# Patient Record
Sex: Female | Born: 1990 | Race: White | Hispanic: No | Marital: Single | Smoking: Never smoker
Health system: Southern US, Community
[De-identification: ages and names within clinical notes are randomized; demographics above are authoritative.]

## PROBLEM LIST (undated history)

## (undated) ENCOUNTER — Inpatient Hospital Stay (HOSPITAL_COMMUNITY): Payer: Self-pay

## (undated) DIAGNOSIS — Z9889 Other specified postprocedural states: Secondary | ICD-10-CM

## (undated) DIAGNOSIS — R112 Nausea with vomiting, unspecified: Secondary | ICD-10-CM

## (undated) HISTORY — PX: TOE AMPUTATION: SHX809

## (undated) HISTORY — PX: TONSILLECTOMY: SUR1361

---

## 2012-01-10 ENCOUNTER — Encounter: Payer: Self-pay | Admitting: Obstetrics and Gynecology

## 2012-01-11 ENCOUNTER — Other Ambulatory Visit: Payer: Self-pay | Admitting: Obstetrics & Gynecology

## 2012-01-13 ENCOUNTER — Encounter (HOSPITAL_COMMUNITY): Payer: Self-pay | Admitting: Pharmacist

## 2012-01-26 ENCOUNTER — Ambulatory Visit: Admit: 2012-01-26 | Payer: Self-pay | Admitting: Obstetrics & Gynecology

## 2012-01-26 SURGERY — LAPAROSCOPY OPERATIVE
Anesthesia: General

## 2012-02-21 ENCOUNTER — Emergency Department (HOSPITAL_COMMUNITY)
Admission: EM | Admit: 2012-02-21 | Discharge: 2012-02-22 | Disposition: A | Discharge status: Home / Self Care | Payer: Managed Care, Other (non HMO) | Attending: Emergency Medicine | Admitting: Emergency Medicine

## 2012-02-21 ENCOUNTER — Emergency Department (HOSPITAL_COMMUNITY): Payer: Managed Care, Other (non HMO)

## 2012-02-21 ENCOUNTER — Encounter (HOSPITAL_COMMUNITY): Payer: Self-pay

## 2012-02-21 DIAGNOSIS — O9989 Other specified diseases and conditions complicating pregnancy, childbirth and the puerperium: Secondary | ICD-10-CM | POA: Insufficient documentation

## 2012-02-21 DIAGNOSIS — R109 Unspecified abdominal pain: Secondary | ICD-10-CM | POA: Insufficient documentation

## 2012-02-21 DIAGNOSIS — O200 Threatened abortion: Secondary | ICD-10-CM | POA: Insufficient documentation

## 2012-02-21 LAB — URINE MICROSCOPIC-ADD ON

## 2012-02-21 LAB — CBC
HCT: 40.4 % (ref 36.0–46.0)
MCH: 32.2 pg (ref 26.0–34.0)
MCHC: 34.7 g/dL (ref 30.0–36.0)
MCV: 92.9 fL (ref 78.0–100.0)
Platelets: 264 10*3/uL (ref 150–400)
RDW: 11.8 % (ref 11.5–15.5)
WBC: 7.7 10*3/uL (ref 4.0–10.5)

## 2012-02-21 LAB — URINALYSIS, ROUTINE W REFLEX MICROSCOPIC
Glucose, UA: NEGATIVE mg/dL
Ketones, ur: NEGATIVE mg/dL
Leukocytes, UA: NEGATIVE
Protein, ur: NEGATIVE mg/dL
pH: 7 (ref 5.0–8.0)

## 2012-02-21 NOTE — ED Provider Notes (Signed)
[place] of low abdominal pain onset 5 PM today slight amount of bleeding. Patient currently [redacted] weeks pregnant. Patient alert no distress.  Doug Sou, MD 02/21/12 [phone]

## 2012-02-21 NOTE — ED Notes (Signed)
Ambulatory to room, alert, NAD, calm, interactive, steady gait.

## 2012-02-21 NOTE — ED Notes (Signed)
To [place] prior to I&O.

## 2012-02-21 NOTE — ED Provider Notes (Signed)
History     CSN: [id number]  Arrival date & time 02/21/12  1925   First MD Initiated Contact with Patient 02/21/12 2122     Chief Complaint  Patient presents with  . Abdominal Pain   HPI: Lindsay Woodard is a 21 yo CF, G2P1, at [redacted] weeks gestation, with history of previous miscarriage, presents with abdominal cramping and vaginal spotting. Positive pregnancy test one week ago but has not had a confirmed IUP by [place]. Pain is located in the lower abdomen, described as cramping, currently pain free. Bleeding has been minimal as well. She denies syncope, dizziness, vomiting, diarrhea or recent abdominal trauma.   History reviewed. No pertinent past medical history.  Past Surgical History  Procedure Date  . Toe amputation   . Tonsillectomy    History reviewed. No pertinent family history.  History  Substance Use Topics  . Smoking status: Never Smoker   . Smokeless tobacco: Not on file  . Alcohol Use:     OB History    Grav Para Term Preterm Abortions TAB SAB Ect Mult Living   2    1  1         Review of Systems  Constitutional: Negative for fever, chills and fatigue.  HENT: Negative for congestion and rhinorrhea.   Eyes: Negative for photophobia and visual disturbance.  Respiratory: Negative for cough, chest tightness and shortness of breath.   Cardiovascular: Negative for chest pain and palpitations.  Gastrointestinal: Positive for abdominal pain. Negative for nausea, vomiting and diarrhea.  Genitourinary: Positive for vaginal bleeding. Negative for dysuria, flank pain and decreased urine volume.  Musculoskeletal: Negative for myalgias, back pain and arthralgias.  Skin: Negative for pallor and rash.  Neurological: Negative for dizziness, syncope, light-headedness and headaches.  Psychiatric/Behavioral: Negative for confusion and agitation.  All other systems reviewed and are negative.    Allergies  Review of patient's allergies indicates no known allergies.  Home Medications    No current outpatient prescriptions on file.  BP 113/62  Pulse 90  Temp 98.5 F (36.9 C) (Oral)  Resp 18  SpO2 97%  LMP 01/17/2012  Physical Exam  Nursing note and vitals reviewed. Constitutional: She is oriented to person, place, and time. She appears well-developed and well-nourished. She is cooperative. No distress.  HENT:  Head: Normocephalic and atraumatic.  Mouth/Throat: Oropharynx is clear and moist.  Eyes: Conjunctivae normal and EOM are normal. Pupils are equal, round, and reactive to light.  Neck: Normal range of motion. Neck supple.  Cardiovascular: Normal rate, regular rhythm, normal heart sounds and intact distal pulses.   Pulmonary/Chest: Effort normal and breath sounds normal.  Abdominal: Soft. Bowel sounds are normal. There is no tenderness. There is no guarding.  Genitourinary: Cervix exhibits no motion tenderness. Right adnexum displays no tenderness and no fullness. Left adnexum displays no tenderness and no fullness. There is bleeding (small amount of dry blood in the vaginal vault) around the vagina.    Musculoskeletal: Normal range of motion. She exhibits no edema.  Neurological: She is alert and oriented to person, place, and time.  Skin: Skin is warm and dry.    ED Course  Procedures  Labs Reviewed  HCG, QUANTITATIVE, PREGNANCY - Abnormal; Notable for the following:    hCG, Beta Chain, Quant, S 539 (*)     All other components within normal limits  POCT PREGNANCY, URINE - Abnormal; Notable for the following:    Preg Test, Ur POSITIVE (*)     All other components  within normal limits  URINALYSIS, ROUTINE W REFLEX MICROSCOPIC - Abnormal; Notable for the following:    APPearance CLOUDY (*)     Hgb urine dipstick LARGE (*)     All other components within normal limits  URINE MICROSCOPIC-ADD ON - Abnormal; Notable for the following:    Squamous Epithelial / LPF MANY (*)     Bacteria, UA MANY (*)     All other components within normal limits  CBC   ABO/RH  URINALYSIS, ROUTINE W REFLEX MICROSCOPIC   No results found.  1. Threatened abortion    MDM  21 yo CF, G2P1, at [redacted] weeks gestation, with history of previous miscarriage, presents with abdominal cramping and vaginal spotting. Afebrile, vital signs stable. Abdomen benign. Concern for ectopic vs threatened abortion. OS closed on exam. No adnexal tenderness. HCG 539, Hgb 14. UA with many bacteria, no leuks, many epi cells, likely contamination. Repeat with rare bacteria and few epi cells, still feel likely contamination but she does complain of suprapubic pain so will treat with Keflex. Ultrasound shows IUP c/w with dates by LMP. No adnexal mass or free fluid, doubt ectopic. Presentation c/w threatened abortion. Discussed with patient's Ob, Dr. Tamela Oddi, they will see in clinic on Friday. Patient is to call for appointment tomorrow morning. Felt she was stable for outpatient management as she is HD stable, benign abdomen, confirmed IUP, has close outpatient f/u. Return precautions given. Patient in agreement with plan.   Reviewed imaging, labs and previous medical records, utilized in MDM  Discussed case with Dr. Ethelda Chick  Clinical Impression 1. Threatened abortion.         Margie Billet, MD 02/22/12 [phone]

## 2012-02-21 NOTE — ED Notes (Signed)
Pt reports she is 5 weeks pregnat, developed sudden onset of suprapubic cramping and passing a small amount of blood starting approx 1700 today

## 2012-02-21 NOTE — ED Notes (Signed)
Sitting on stretcher, visitor at [facility], reports mild cramping, denies need for med, denies nausea or other sx at this time, aware of need for I&O cath UA & pending [place].

## 2012-02-22 LAB — URINALYSIS, ROUTINE W REFLEX MICROSCOPIC
Bilirubin Urine: NEGATIVE
Glucose, UA: NEGATIVE mg/dL
Ketones, ur: 40 mg/dL — AB
Nitrite: NEGATIVE
Protein, ur: NEGATIVE mg/dL
pH: 7 (ref 5.0–8.0)

## 2012-02-22 LAB — URINALYSIS, MICROSCOPIC ONLY
Bilirubin Urine: NEGATIVE
Hgb urine dipstick: NEGATIVE
Ketones, ur: 40 mg/dL — AB
Specific Gravity, Urine: 1.024 (ref 1.005–1.030)
pH: 7 (ref 5.0–8.0)

## 2012-02-22 MED ORDER — CEPHALEXIN 500 MG PO CAPS: 500.0000 mg | ORAL_CAPSULE | Freq: Two times a day (BID) | ORAL | Status: DC

## 2012-02-22 NOTE — ED Notes (Signed)
Pt back from [place], no changes, calm, alert, NAD, visitor at [facility].

## 2012-02-22 NOTE — ED Notes (Signed)
Updated.

## 2012-02-22 NOTE — ED Notes (Signed)
Pt updated, pending microscopic urine results.

## 2012-02-22 NOTE — ED Notes (Addendum)
Pt choosing to leave, out the door with visitor, no changes, "did not want to wait any longer", "would f/u with medical records and her ob/gyn". No changes, alert, NAD, calm, steady gait. Leaving prior to receiving paperwork or speaking with MD.

## 2012-02-22 NOTE — ED Notes (Signed)
updated

## 2012-02-23 ENCOUNTER — Inpatient Hospital Stay (HOSPITAL_COMMUNITY)
Admission: AD | Admit: 2012-02-23 | Discharge: 2012-02-23 | Disposition: A | Discharge status: Home / Self Care | Payer: Managed Care, Other (non HMO) | Source: Ambulatory Visit | Attending: Obstetrics & Gynecology | Admitting: Obstetrics & Gynecology

## 2012-02-23 ENCOUNTER — Encounter (HOSPITAL_COMMUNITY): Payer: Self-pay | Admitting: *Deleted

## 2012-02-23 DIAGNOSIS — O0281 Inappropriate change in quantitative human chorionic gonadotropin (hCG) in early pregnancy: Secondary | ICD-10-CM

## 2012-02-23 DIAGNOSIS — O200 Threatened abortion: Secondary | ICD-10-CM | POA: Insufficient documentation

## 2012-02-23 LAB — URINE CULTURE

## 2012-02-23 MED ORDER — HYDROCODONE-ACETAMINOPHEN 5-500 MG PO TABS
ORAL_TABLET | ORAL | Status: DC
Start: 2012-02-23 — End: 2012-09-25

## 2012-02-23 MED ORDER — KETOROLAC TROMETHAMINE 60 MG/2ML IM SOLN
60.0000 mg | Freq: Once | INTRAMUSCULAR | Status: AC
  Administered 2012-02-23: 60 mg via INTRAMUSCULAR
  Filled 2012-02-23: qty 2

## 2012-02-23 NOTE — MAU Provider Note (Signed)
History     CSN: [id number]  Arrival date and time: 02/23/12 7253   First Provider Initiated Contact with Patient 02/23/12 2057      Chief Complaint  Patient presents with  . Vaginal Bleeding  . Abdominal Cramping   HPI Lindsay Woodard 21 y.o. LMP 01-17-12  GA 5w 2d  Was seen at [facility] ER on 02-21-12.  Positive pregnancy test with quant of 539.  Had some spotting and was seen in the office on 02-22-12 but was too early for full quant evaluation.  (See ultrasound results from 02-21-12 listed below.)  Previous chart reviewed.  Has continued to have some lower abdominal cramping which has been worse today (5/10) and some small quarter sized blood clots.  Today's bleeding is different from the miscarriage she has had earlier and client is wondering what is happening.  Blood type is A positive.  OB History    Grav Para Term Preterm Abortions TAB SAB Ect Mult Living   2    1  1          History reviewed. No pertinent past medical history.  Past Surgical History  Procedure Date  . Toe amputation   . Tonsillectomy     No family history on file.  History  Substance Use Topics  . Smoking status: Never Smoker   . Smokeless tobacco: Not on file  . Alcohol Use:     Allergies: No Known Allergies  Prescriptions prior to admission  Medication Sig Dispense Refill  . Prenatal Vit-Fe Fumarate-FA (PRENATAL MULTIVITAMIN) TABS Take 1 tablet by mouth daily.        Review of Systems  Constitutional: Negative for fever.  Gastrointestinal: Positive for abdominal pain. Negative for nausea, vomiting, diarrhea and constipation.  Genitourinary: Negative for dysuria.       Vaginal bleeding.   Physical Exam   Temperature 98.4 F (36.9 C), temperature source Oral, resp. rate 20, height 5' (1.524 m), weight 68.04 kg (150 lb), last menstrual period 01/17/2012.  Physical Exam  Nursing note and vitals reviewed. Constitutional: She is oriented to person, place, and time. She appears well-developed and  well-nourished.  HENT:  Head: Normocephalic.  Eyes: EOM are normal.  Neck: Neck supple.  GI: Soft. There is tenderness. There is no rebound and no guarding.       Mild lower abdominal tenderness  Musculoskeletal: Normal range of motion.  Neurological: She is alert and oriented to person, place, and time.  Skin: Skin is warm and dry.  Psychiatric: She has a normal mood and affect.    MAU Course  Procedures  Ultrasound done on 02-21-12 Clinical Data: Abdominal pain and cramping. Vaginal bleeding. 5-  week-0-day gestational age by LMP.  OBSTETRIC <14 WK [place] AND TRANSVAGINAL OB US  Technique: Both transabdominal and transvaginal ultrasound  examinations were performed for complete evaluation of the  gestation as well as the maternal uterus, adnexal regions, and  pelvic cul-de-sac. Transvaginal technique was performed to assess  early pregnancy.  Comparison: None.  Intrauterine gestational sac: Visualized/normal in shape.  Yolk sac: None visualized  Embryo: None visualized  MSD: 3 mm 5 w 0 d  Maternal uterus/adnexae:  Both ovaries are normal in appearance. No mass or free fluid  identified.  IMPRESSION:  1. Single 5-week intrauterine gestational sac, which is concordant  with LMP.  2. No adnexal mass or free fluid identified  MDM Results for orders placed during the hospital encounter of 02/23/12 (from the past 24 hour(s))  HCG, QUANTITATIVE,  PREGNANCY     Status: Abnormal   Collection Time   02/23/12  7:45 PM      Component Value Range   hCG, Beta Chain, Quant, S 560 (*) <5 mIU/mL    2110  Consult with Dr. Tamela Oddi re: plan of care  Assessment and Plan  Inappropriate rise in quants suggesting inevidable miscarriage  Plan Will give Toradol 60 mg IM for pain in MAU as client's pain has increased. Rx vicodin one po q 4-6 hours as needed for pain (#8) no refills Follow up in the office on Monday. Call your doctor if you have questions or concerns. Return if you have  severe pain or severe bleeding.    BURLESON,TERRI 02/23/2012, 8:57 PM

## 2012-02-23 NOTE — MAU Note (Signed)
I'm [redacted]wks pregnant. Started passing a lot of blood clots today and having cramping

## 2012-02-23 NOTE — ED Provider Notes (Signed)
I have personally seen and examined the patient.  I have discussed the plan of care with the resident.  I have reviewed the documentation on PMH/FH/Soc. History.  I have reviewed the documentation of the resident and agree.  Muslima Toppins, MD 02/23/12 1453 

## 2012-02-23 NOTE — MAU Note (Signed)
Cramping started today with bleeding, passing clots.

## 2012-02-26 ENCOUNTER — Other Ambulatory Visit: Payer: Self-pay | Admitting: Obstetrics & Gynecology

## 2012-02-26 DIAGNOSIS — O3680X0 Pregnancy with inconclusive fetal viability, not applicable or unspecified: Secondary | ICD-10-CM

## 2012-02-29 ENCOUNTER — Ambulatory Visit (HOSPITAL_COMMUNITY)
Admission: RE | Admit: 2012-02-29 | Discharge: 2012-02-29 | Disposition: A | Discharge status: Home / Self Care | Payer: Managed Care, Other (non HMO) | Source: Ambulatory Visit | Attending: Obstetrics & Gynecology | Admitting: Obstetrics & Gynecology

## 2012-02-29 DIAGNOSIS — Z3689 Encounter for other specified antenatal screening: Secondary | ICD-10-CM | POA: Insufficient documentation

## 2012-02-29 DIAGNOSIS — O209 Hemorrhage in early pregnancy, unspecified: Secondary | ICD-10-CM | POA: Insufficient documentation

## 2012-02-29 DIAGNOSIS — O3680X0 Pregnancy with inconclusive fetal viability, not applicable or unspecified: Secondary | ICD-10-CM

## 2012-03-20 NOTE — L&D Delivery Note (Signed)
Delivery Note At 9:51 PM a viable female was delivered via  (Presentation: ;  ).  APGAR: , ; weight .   Placenta status: , .  Cord:  with the following complications: .  Cord pH: not done  Anesthesia:   Episiotomy:  Lacerations:  Suture Repair: 2.0 Est. Blood Loss (mL):   Mom to postpartum.  Baby to Couplet care / Skin to Skin.  Lindsay Woodard A 02/22/2013, 10:04 PM

## 2012-06-12 ENCOUNTER — Ambulatory Visit: Payer: Self-pay | Admitting: Obstetrics & Gynecology

## 2012-08-13 ENCOUNTER — Encounter: Payer: Self-pay | Admitting: Obstetrics

## 2012-08-13 ENCOUNTER — Ambulatory Visit (INDEPENDENT_AMBULATORY_CARE_PROVIDER_SITE_OTHER): Payer: Managed Care, Other (non HMO) | Admitting: Obstetrics

## 2012-08-13 VITALS — BP 109/70 | Temp 97.6°F | Wt 147.6 lb

## 2012-08-13 DIAGNOSIS — Z3482 Encounter for supervision of other normal pregnancy, second trimester: Secondary | ICD-10-CM

## 2012-08-13 DIAGNOSIS — Z3201 Encounter for pregnancy test, result positive: Secondary | ICD-10-CM

## 2012-08-13 LAB — POCT URINALYSIS DIPSTICK
Blood, UA: NEGATIVE
Spec Grav, UA: 1.02
Urobilinogen, UA: NEGATIVE

## 2012-08-13 NOTE — Progress Notes (Signed)
Pulse- 86 . Subjective:    Lindsay Woodard is being seen today for her first obstetrical visit.  This is not a planned pregnancy. She is at [redacted]w[redacted]d gestation. Her obstetrical history is significant for none. Relationship with FOB: significant other, living together. Patient does intend to breast feed. Pregnancy history fully reviewed.  Patient has a family history of Junvenile Dermatomyositis.  Menstrual History: OB History   Grav Para Term Preterm Abortions TAB SAB Ect Mult Living   3    2  1          Menarche age: 10 Patient's last menstrual period was 05/14/2012.    The following portions of the patient's history were reviewed and updated as appropriate: allergies, current medications, past family history, past medical history, past social history, past surgical history and problem list.  Review of Systems Pertinent items are noted in HPI.    Objective:    General appearance: alert and no distress Abdomen: normal findings: soft, non-tender Pelvic: cervix normal in appearance, no cervical motion tenderness and vagina normal without discharge    Assessment:    Pregnancy at [redacted]w[redacted]d weeks    Plan:    Initial labs drawn. Prenatal vitamins. Problem list reviewed and updated. AFP3 discussed: requested. Role of ultrasound in pregnancy discussed; fetal survey: requested. Amniocentesis discussed: not indicated. Follow up in 2 weeks. 50% of 20 min visit spent on counseling and coordination of care.

## 2012-08-14 ENCOUNTER — Encounter: Payer: Self-pay | Admitting: Obstetrics

## 2012-08-14 LAB — PAP IG W/ RFLX HPV ASCU

## 2012-08-14 LAB — HIV ANTIBODY (ROUTINE TESTING W REFLEX): HIV: NONREACTIVE

## 2012-08-14 LAB — OBSTETRIC PANEL
Eosinophils Relative: 1 % (ref 0–5)
HCT: 37.1 % (ref 36.0–46.0)
Hemoglobin: 13 g/dL (ref 12.0–15.0)
Lymphocytes Relative: 19 % (ref 12–46)
Lymphs Abs: 1.6 10*3/uL (ref 0.7–4.0)
MCV: 90.9 fL (ref 78.0–100.0)
Monocytes Absolute: 0.5 10*3/uL (ref 0.1–1.0)
Monocytes Relative: 6 % (ref 3–12)
RBC: 4.08 MIL/uL (ref 3.87–5.11)
Rubella: 3.63 Index — ABNORMAL HIGH (ref <0.90)
WBC: 8.7 10*3/uL (ref 4.0–10.5)

## 2012-08-14 LAB — CULTURE, OB URINE: Colony Count: NO GROWTH

## 2012-08-14 LAB — GC/CHLAMYDIA PROBE AMP: CT Probe RNA: NEGATIVE

## 2012-08-14 LAB — WET PREP BY MOLECULAR PROBE: Gardnerella vaginalis: NEGATIVE

## 2012-08-14 LAB — VITAMIN D 25 HYDROXY (VIT D DEFICIENCY, FRACTURES): Vit D, 25-Hydroxy: 49 ng/mL (ref 30–89)

## 2012-08-15 LAB — HEMOGLOBINOPATHY EVALUATION: Hgb S Quant: 0 %

## 2012-09-02 ENCOUNTER — Ambulatory Visit (INDEPENDENT_AMBULATORY_CARE_PROVIDER_SITE_OTHER): Payer: Managed Care, Other (non HMO) | Admitting: Obstetrics & Gynecology

## 2012-09-02 ENCOUNTER — Encounter: Payer: Self-pay | Admitting: Obstetrics & Gynecology

## 2012-09-02 VITALS — BP 105/65 | Temp 98.8°F | Wt 147.2 lb

## 2012-09-02 DIAGNOSIS — Z3402 Encounter for supervision of normal first pregnancy, second trimester: Secondary | ICD-10-CM

## 2012-09-02 DIAGNOSIS — Z3481 Encounter for supervision of other normal pregnancy, first trimester: Secondary | ICD-10-CM

## 2012-09-02 DIAGNOSIS — Z3400 Encounter for supervision of normal first pregnancy, unspecified trimester: Secondary | ICD-10-CM | POA: Insufficient documentation

## 2012-09-02 LAB — POCT URINALYSIS DIPSTICK
Blood, UA: NEGATIVE
Nitrite, UA: NEGATIVE
Protein, UA: NEGATIVE
Urobilinogen, UA: NEGATIVE
pH, UA: 5.5

## 2012-09-02 NOTE — Progress Notes (Signed)
Pulse-78 No complaints.  

## 2012-09-02 NOTE — Patient Instructions (Signed)
CF Gene Mutation Testing This is a test used to detect cystic fibrosis (CF) genetic mutations to establish CF carrier status or to establish the diagnosis of CF in an individual. The CF gene mutation test identifies mutations in the CFTR gene on chromosome 7. Each cell in the human body (except sperm and eggs) has 46 chromosomes (23 inherited from the mother and 23 from the father). Genes on these chromosomes form the body's blueprint for producing proteins that control body functions. Cystic fibrosis is caused by a mutation in a pair of genes located on chromosomes 7. Both copies of this gene must be abnormal to cause CF. If only one copy of the gene pair is mutated, the patient will be a carrier. Carriers are not ill, they do not have any symptoms, but they can pass their abnormal CF gene copy on to their children.  When a newborn infant has meconium ileus (no stools in the first 24 to 48 hours of life) or when a person has symptoms of CF (salty sweat, persistent respiratory infections, wheezing, persistent diarrhea, foul-smelling greasy stools, malnutrition, and vitamin deficiency); if a person has a positive sweat chloride or IRT test or a close relative who has been diagnosed with CF; when a patient is undergoing genetic counseling and wants to find out if they are a CF carrier; or for prenatal diagnosis. PREPARATION FOR TEST A blood sample drawn from an infant's heel; a spot of blood that is put onto filter paper; or a blood sample is drawn from a vein in the arm. NORMAL FINDINGS No genetic mutation. Ranges for normal findings may vary among different laboratories and hospitals. You should always check with your doctor after having lab work or other tests done to discuss the meaning of your test results and whether your values are considered within normal limits. MEANING OF TEST Your caregiver will go over the test results with you and discuss the importance and meaning of your results, as well as  treatment options and the need for additional tests if necessary. OBTAINING THE TEST RESULTS It is your responsibility to obtain your test results. Ask the lab or department performing the test when and how you will get your results. Document Released: 03/30/2004 Document Revised: 05/29/2011 Document Reviewed: 02/12/2008 [facility] Patient Information 2014 [place], [place]. AFP Maternal This is a routine screen (tests) used to check for fetal abnormalities such as Down syndrome and neural tube defects. Down Syndrome is a chromosomal abnormality, sometimes called Trisomy 73. Neural tube defects are serious birth defects. The brain, spinal cord, or their coverings do not develop completely. Women should be tested in the 15th to 20th week of pregnancy. The msAFP screen involves three or four tests that measure substances found in the blood that make the testing better. During development, AFP levels in fetal blood and amniotic fluid rise until about 12 weeks. The levels then gradually fall until birth. AFP is a protein produce by fetal tissue. AFP crosses the placenta and appears in the maternal blood. A baby with an open neural tube defect has an opening in its spine, head, or abdominal wall that allows higher-than-usual amounts of AFP to pass into the mother's blood. If a screen is positive, more tests are needed to make a diagnosis. These include ultrasound and perhaps amniocentesis (checking the fluid that surrounds the baby). These tests are used to help women and their caregivers make decisions about the management of their pregnancies. In pregnancies where the fetus is carrying the chromosomal  defect that results in Down syndrome, the levels of AFP and unconjugated estriol tend to be low and hCG and inhibin A levels high.  PREPARATION FOR TEST Blood is drawn from a vein in your arm usually between the 15th and 20th weeks of pregnancy. Four different tests on your blood are done. These are AFP, hCG,  unconjugated estriol, and inhibin A. The combination of tests produces a more accurate result. NORMAL FINDINGS   Adult: less than 40ng/mL or less than 40 mg/L (SI units)  Child younger than1 year: less than 30 ng/mL Ranges are stratified by weeks of gestation and vary among laboratories. Ranges for normal findings may vary among different laboratories and hospitals. You should always check with your doctor after having lab work or other tests done to discuss the meaning of your test results and whether your values are considered within normal limits. MEANING OF TEST  These are screening tests. Not all fetal abnormalities will give positive test results. Of all women who have positive AFP screening results, only a very small number of them have babies who actually have a neural tube defect or chromosomal abnormality. Your caregiver will go over the test results with you and discuss the importance and meaning of your results, as well as treatment options and the need for additional tests if necessary. OBTAINING THE TEST RESULTS It is your responsibility to obtain your test results. Ask the lab or department performing the test when and how you will get your results. Document Released: 03/28/2004 Document Revised: 05/29/2011 Document Reviewed: 02/08/2008 [facility] Patient Information 2014 [place], [place].

## 2012-09-02 NOTE — Progress Notes (Signed)
Doing well 

## 2012-09-03 LAB — AFP, QUAD SCREEN
AFP: 25.5 IU/mL
Age Alone: 1:1150 {titer}
MoM for AFP: 0.85
Trisomy 18 (Edward) Syndrome Interp.: 1:87800 {titer}

## 2012-09-04 LAB — CYSTIC FIBROSIS DIAGNOSTIC STUDY

## 2012-09-22 ENCOUNTER — Other Ambulatory Visit: Payer: Self-pay | Admitting: *Deleted

## 2012-09-22 DIAGNOSIS — Z3402 Encounter for supervision of normal first pregnancy, second trimester: Secondary | ICD-10-CM

## 2012-09-25 ENCOUNTER — Encounter: Payer: Self-pay | Admitting: Obstetrics & Gynecology

## 2012-09-25 ENCOUNTER — Other Ambulatory Visit: Payer: Managed Care, Other (non HMO)

## 2012-09-25 ENCOUNTER — Ambulatory Visit (INDEPENDENT_AMBULATORY_CARE_PROVIDER_SITE_OTHER): Payer: Managed Care, Other (non HMO) | Admitting: Obstetrics & Gynecology

## 2012-09-25 VITALS — BP 100/64 | Temp 97.9°F | Wt 153.0 lb

## 2012-09-25 DIAGNOSIS — Z3480 Encounter for supervision of other normal pregnancy, unspecified trimester: Secondary | ICD-10-CM

## 2012-09-25 DIAGNOSIS — Z3482 Encounter for supervision of other normal pregnancy, second trimester: Secondary | ICD-10-CM

## 2012-09-25 DIAGNOSIS — Z3402 Encounter for supervision of normal first pregnancy, second trimester: Secondary | ICD-10-CM

## 2012-09-25 LAB — POCT URINALYSIS DIPSTICK
Ketones, UA: NEGATIVE
Leukocytes, UA: NEGATIVE
Nitrite, UA: NEGATIVE
Protein, UA: NEGATIVE
pH, UA: 7

## 2012-09-25 NOTE — Progress Notes (Signed)
Pulse-71 Pt c/o "pulling" with urination x 2 weeks. Pt c/o increased left hip pain x 2 weeks worse with exertion.

## 2012-09-25 NOTE — Patient Instructions (Addendum)
Glucose Tolerance Test This is a test to see how your body processes carbohydrates. This test is often done to check patients for diabetes or the possibility of developing it. PREPARATION FOR TEST You should have nothing to eat or drink 12 hours before the test. You will be given a form of sugar (glucose) and then blood samples will be drawn from your vein to determine the level of sugar in your blood. Alternatively, blood may be drawn from your finger for testing. You should not smoke or exercise during the test. NORMAL FINDINGS  Fasting: 70-115 mg/dL  30 minutes: less than 200 mg/dL  1 hour: less than 200 mg/dL  2 hours: less than 140 mg/dL  3 hours: 70-115 mg/dL  4 hours: 70-115 mg/dL Ranges for normal findings may vary among different laboratories and hospitals. You should always check with your doctor after having lab work or other tests done to discuss the meaning of your test results and whether your values are considered within normal limits. MEANING OF TEST Your caregiver will go over the test results with you and discuss the importance and meaning of your results, as well as treatment options and the need for additional tests. OBTAINING THE TEST RESULTS It is your responsibility to obtain your test results. Ask the lab or department performing the test when and how you will get your results. Document Released: 03/29/2004 Document Revised: 05/29/2011 Document Reviewed: 02/15/2008 [employer] Patient Information 2014 [employer].  

## 2012-09-25 NOTE — Progress Notes (Signed)
Doing well.  Supportive measures reviewed.

## 2012-09-27 ENCOUNTER — Ambulatory Visit (HOSPITAL_COMMUNITY)
Admission: RE | Admit: 2012-09-27 | Discharge: 2012-09-27 | Disposition: A | Discharge status: Home / Self Care | Payer: Managed Care, Other (non HMO) | Source: Ambulatory Visit | Attending: Obstetrics & Gynecology | Admitting: Obstetrics & Gynecology

## 2012-09-27 ENCOUNTER — Other Ambulatory Visit: Payer: Self-pay | Admitting: *Deleted

## 2012-09-27 DIAGNOSIS — Z3689 Encounter for other specified antenatal screening: Secondary | ICD-10-CM | POA: Insufficient documentation

## 2012-09-27 DIAGNOSIS — Z3402 Encounter for supervision of normal first pregnancy, second trimester: Secondary | ICD-10-CM

## 2012-10-14 ENCOUNTER — Other Ambulatory Visit: Payer: Self-pay | Admitting: *Deleted

## 2012-10-14 DIAGNOSIS — B001 Herpesviral vesicular dermatitis: Secondary | ICD-10-CM

## 2012-10-14 MED ORDER — VALACYCLOVIR HCL 500 MG PO TABS: 2.0000 g | ORAL_TABLET | ORAL | Status: DC

## 2012-10-15 ENCOUNTER — Encounter: Payer: Self-pay | Admitting: Obstetrics

## 2012-10-23 ENCOUNTER — Ambulatory Visit (INDEPENDENT_AMBULATORY_CARE_PROVIDER_SITE_OTHER): Payer: Managed Care, Other (non HMO) | Admitting: Obstetrics & Gynecology

## 2012-10-23 ENCOUNTER — Other Ambulatory Visit: Payer: Managed Care, Other (non HMO)

## 2012-10-23 VITALS — BP 107/72 | Temp 97.9°F | Wt 161.8 lb

## 2012-10-23 DIAGNOSIS — Z3402 Encounter for supervision of normal first pregnancy, second trimester: Secondary | ICD-10-CM

## 2012-10-23 DIAGNOSIS — Z3400 Encounter for supervision of normal first pregnancy, unspecified trimester: Secondary | ICD-10-CM

## 2012-10-23 DIAGNOSIS — Z3480 Encounter for supervision of other normal pregnancy, unspecified trimester: Secondary | ICD-10-CM

## 2012-10-23 LAB — POCT URINALYSIS DIPSTICK
Blood, UA: NEGATIVE
Glucose, UA: NEGATIVE
Ketones, UA: NEGATIVE
Spec Grav, UA: 1.015
Urobilinogen, UA: NEGATIVE

## 2012-10-23 NOTE — Progress Notes (Signed)
Pulse: 81

## 2012-10-24 ENCOUNTER — Encounter: Payer: Self-pay | Admitting: Obstetrics & Gynecology

## 2012-10-24 LAB — CBC
Hemoglobin: 11.9 g/dL — ABNORMAL LOW (ref 12.0–15.0)
MCH: 31.9 pg (ref 26.0–34.0)
MCHC: 33.4 g/dL (ref 30.0–36.0)
MCV: 95.4 fL (ref 78.0–100.0)
Platelets: 264 10*3/uL (ref 150–400)

## 2012-10-24 LAB — GLUCOSE TOLERANCE, 2 HOURS W/ 1HR: Glucose, Fasting: 63 mg/dL — ABNORMAL LOW (ref 70–99)

## 2012-10-24 LAB — HIV ANTIBODY (ROUTINE TESTING W REFLEX): HIV: NONREACTIVE

## 2012-10-31 ENCOUNTER — Inpatient Hospital Stay (HOSPITAL_COMMUNITY): Admission: AD | Admit: 2012-10-31 | Payer: Self-pay | Source: Ambulatory Visit | Admitting: Obstetrics & Gynecology

## 2012-11-13 ENCOUNTER — Ambulatory Visit (INDEPENDENT_AMBULATORY_CARE_PROVIDER_SITE_OTHER): Payer: Managed Care, Other (non HMO) | Admitting: Obstetrics & Gynecology

## 2012-11-13 ENCOUNTER — Encounter: Payer: Self-pay | Admitting: Obstetrics & Gynecology

## 2012-11-13 VITALS — BP 99/63 | Temp 98.3°F | Wt 165.4 lb

## 2012-11-13 DIAGNOSIS — Z3400 Encounter for supervision of normal first pregnancy, unspecified trimester: Secondary | ICD-10-CM

## 2012-11-13 DIAGNOSIS — Z3402 Encounter for supervision of normal first pregnancy, second trimester: Secondary | ICD-10-CM

## 2012-11-13 DIAGNOSIS — Z3403 Encounter for supervision of normal first pregnancy, third trimester: Secondary | ICD-10-CM

## 2012-11-13 LAB — POCT URINALYSIS DIPSTICK
Bilirubin, UA: NEGATIVE
Ketones, UA: NEGATIVE
Leukocytes, UA: NEGATIVE
Protein, UA: NEGATIVE
Spec Grav, UA: 1.02

## 2012-11-13 NOTE — Progress Notes (Signed)
Pulse: 83

## 2012-11-14 ENCOUNTER — Encounter: Payer: Self-pay | Admitting: Obstetrics & Gynecology

## 2012-11-19 ENCOUNTER — Encounter: Payer: Self-pay | Admitting: Obstetrics & Gynecology

## 2012-11-28 ENCOUNTER — Ambulatory Visit (INDEPENDENT_AMBULATORY_CARE_PROVIDER_SITE_OTHER): Payer: Managed Care, Other (non HMO) | Admitting: Advanced Practice Midwife

## 2012-11-28 VITALS — BP 109/71 | Temp 84.0°F | Wt 167.0 lb

## 2012-11-28 DIAGNOSIS — O26899 Other specified pregnancy related conditions, unspecified trimester: Secondary | ICD-10-CM

## 2012-11-28 DIAGNOSIS — M549 Dorsalgia, unspecified: Secondary | ICD-10-CM

## 2012-11-28 DIAGNOSIS — Z3402 Encounter for supervision of normal first pregnancy, second trimester: Secondary | ICD-10-CM

## 2012-11-28 DIAGNOSIS — M5432 Sciatica, left side: Secondary | ICD-10-CM

## 2012-11-28 DIAGNOSIS — M5430 Sciatica, unspecified side: Secondary | ICD-10-CM | POA: Insufficient documentation

## 2012-11-28 DIAGNOSIS — Z3400 Encounter for supervision of normal first pregnancy, unspecified trimester: Secondary | ICD-10-CM

## 2012-11-28 LAB — POCT URINALYSIS DIPSTICK
Bilirubin, UA: NEGATIVE
Glucose, UA: NEGATIVE
Ketones, UA: NEGATIVE
Leukocytes, UA: NEGATIVE
Nitrite, UA: NEGATIVE
pH, UA: 7

## 2012-11-28 NOTE — Progress Notes (Signed)
P 84 Patient reports she has no concerns today.

## 2012-11-28 NOTE — Progress Notes (Signed)
Subjective: Lindsay Woodard is a 22 y.o. at 28 weeks by LMP  Patient denies vaginal leaking of fluid or bleeding, denies contractions.  Reports positive fetal movment.  Reports back pain and pain radiating down left leg.   Expecting a girl, plans to BRF  Objective: Filed Vitals:   11/28/12 0945  BP: 109/71  Temp: 84 F (28.9 C)   140 FHR 28 Fundal Height Fetal Position unknown  Reviewed [place] today.  Assessment: Patient Active Problem List   Diagnosis Date Noted  . Sciatica 11/28/2012  . Back pain complicating pregnancy 11/28/2012  . Supervision of normal first pregnancy 09/02/2012    Plan: Patient to return to clinic in 2 weeks Reviewed warning signs in pregnancy. Patient to call with concerns PRN. Reviewed triage location. Reviewed comfort measures for back pain, complimentary medicine options, encouraged a belly band and supportive shoes. Encouraged warm baths PRN. Prenatal yoga and pilates may be beneficial.  Reviewed BRF classes  Guerino Caporale Wilson Singer CNM

## 2012-12-12 ENCOUNTER — Encounter: Payer: Managed Care, Other (non HMO) | Admitting: Obstetrics & Gynecology

## 2012-12-16 ENCOUNTER — Ambulatory Visit (INDEPENDENT_AMBULATORY_CARE_PROVIDER_SITE_OTHER): Payer: Managed Care, Other (non HMO) | Admitting: Obstetrics & Gynecology

## 2012-12-16 ENCOUNTER — Encounter: Payer: Self-pay | Admitting: Obstetrics & Gynecology

## 2012-12-16 VITALS — BP 110/76 | Wt 174.0 lb

## 2012-12-16 DIAGNOSIS — Z113 Encounter for screening for infections with a predominantly sexual mode of transmission: Secondary | ICD-10-CM

## 2012-12-16 DIAGNOSIS — Z3403 Encounter for supervision of normal first pregnancy, third trimester: Secondary | ICD-10-CM

## 2012-12-16 DIAGNOSIS — Z3400 Encounter for supervision of normal first pregnancy, unspecified trimester: Secondary | ICD-10-CM

## 2012-12-16 LAB — WET PREP BY MOLECULAR PROBE: Candida species: NEGATIVE

## 2012-12-16 LAB — POCT URINALYSIS DIPSTICK
Bilirubin, UA: NEGATIVE
Blood, UA: NEGATIVE
Glucose, UA: NEGATIVE
Leukocytes, UA: NEGATIVE
Nitrite, UA: NEGATIVE
Urobilinogen, UA: NEGATIVE

## 2012-12-16 NOTE — Progress Notes (Signed)
P- 91 Pt states she has had some discomfort with walking. Pt states she had a small amount of bleeding with intercourse.

## 2012-12-17 LAB — CULTURE, OB URINE: Colony Count: NO GROWTH

## 2012-12-17 LAB — GC/CHLAMYDIA PROBE AMP: GC Probe RNA: NEGATIVE

## 2012-12-17 NOTE — Patient Instructions (Signed)

## 2013-01-01 ENCOUNTER — Ambulatory Visit (INDEPENDENT_AMBULATORY_CARE_PROVIDER_SITE_OTHER): Payer: Managed Care, Other (non HMO) | Admitting: Obstetrics & Gynecology

## 2013-01-01 ENCOUNTER — Encounter: Payer: Self-pay | Admitting: Obstetrics & Gynecology

## 2013-01-01 ENCOUNTER — Encounter: Payer: Self-pay | Admitting: Obstetrics

## 2013-01-01 VITALS — BP 99/67 | Temp 97.6°F | Wt 177.4 lb

## 2013-01-01 DIAGNOSIS — Z3403 Encounter for supervision of normal first pregnancy, third trimester: Secondary | ICD-10-CM

## 2013-01-01 DIAGNOSIS — R002 Palpitations: Secondary | ICD-10-CM

## 2013-01-01 DIAGNOSIS — Z3400 Encounter for supervision of normal first pregnancy, unspecified trimester: Secondary | ICD-10-CM

## 2013-01-01 LAB — BASIC METABOLIC PANEL
CO2: 22 mEq/L (ref 19–32)
Chloride: 103 mEq/L (ref 96–112)
Creat: 0.54 mg/dL (ref 0.50–1.10)
Potassium: 3.8 mEq/L (ref 3.5–5.3)
Sodium: 137 mEq/L (ref 135–145)

## 2013-01-01 LAB — CBC
Hemoglobin: 12.6 g/dL (ref 12.0–15.0)
MCH: 32.1 pg (ref 26.0–34.0)
Platelets: 259 10*3/uL (ref 150–400)
RBC: 3.92 MIL/uL (ref 3.87–5.11)
WBC: 8.7 10*3/uL (ref 4.0–10.5)

## 2013-01-01 LAB — POCT URINALYSIS DIPSTICK
Bilirubin, UA: NEGATIVE
Glucose, UA: NEGATIVE
Ketones, UA: NEGATIVE
Leukocytes, UA: NEGATIVE
Nitrite, UA: NEGATIVE
Spec Grav, UA: 1.02
pH, UA: 6

## 2013-01-01 NOTE — Patient Instructions (Signed)

## 2013-01-01 NOTE — Progress Notes (Signed)
Pulse-96 Doing well.

## 2013-01-02 LAB — TSH: TSH: 0.886 u[IU]/mL (ref 0.350–4.500)

## 2013-01-02 LAB — T4, FREE: Free T4: 0.96 ng/dL (ref 0.80–1.80)

## 2013-01-07 ENCOUNTER — Other Ambulatory Visit: Payer: Self-pay | Admitting: Obstetrics & Gynecology

## 2013-01-07 ENCOUNTER — Other Ambulatory Visit: Payer: Managed Care, Other (non HMO)

## 2013-01-08 ENCOUNTER — Ambulatory Visit (INDEPENDENT_AMBULATORY_CARE_PROVIDER_SITE_OTHER): Payer: Managed Care, Other (non HMO)

## 2013-01-08 DIAGNOSIS — O3663X1 Maternal care for excessive fetal growth, third trimester, fetus 1: Secondary | ICD-10-CM

## 2013-01-08 DIAGNOSIS — O365990 Maternal care for other known or suspected poor fetal growth, unspecified trimester, not applicable or unspecified: Secondary | ICD-10-CM

## 2013-01-09 ENCOUNTER — Encounter: Payer: Self-pay | Admitting: Obstetrics & Gynecology

## 2013-01-09 LAB — US OB DETAIL + 14 WK

## 2013-01-15 ENCOUNTER — Encounter: Payer: Self-pay | Admitting: Obstetrics & Gynecology

## 2013-01-15 ENCOUNTER — Ambulatory Visit (INDEPENDENT_AMBULATORY_CARE_PROVIDER_SITE_OTHER): Payer: Managed Care, Other (non HMO) | Admitting: Obstetrics & Gynecology

## 2013-01-15 VITALS — BP 108/70 | Temp 97.0°F | Wt 179.0 lb

## 2013-01-15 DIAGNOSIS — Z3400 Encounter for supervision of normal first pregnancy, unspecified trimester: Secondary | ICD-10-CM

## 2013-01-15 DIAGNOSIS — Z3403 Encounter for supervision of normal first pregnancy, third trimester: Secondary | ICD-10-CM

## 2013-01-15 LAB — POCT URINALYSIS DIPSTICK
Blood, UA: NEGATIVE
Ketones, UA: NEGATIVE
Spec Grav, UA: 1.02
pH, UA: 6

## 2013-01-15 NOTE — Patient Instructions (Signed)
Patient information: Group B streptococcus and pregnancy (Beyond the Basics)  Authors Karen M Puopolo, MD, PhD Carol J Baker, MD Section Editors Charles J Lockwood, MD Daniel J Sexton, MD Deputy Editor Vanessa A Barss, MD Disclosures  All topics are updated as new evidence becomes available and our peer review process is complete.  Literature review current through: Feb 2014.  This topic last updated: Sep 18, 2011.  INTRODUCTION - Group B streptococcus (GBS) is a bacterium that can cause serious infections in pregnant women and newborn babies. GBS is one of many types of streptococcal bacteria, sometimes called "strep." This article discusses GBS, its effect on pregnant women and infants, and ways to prevent complications of GBS. More detailed information about GBS is available by subscription. (See "Group B streptococcal infection in pregnant women".) WHAT IS GROUP B STREP INFECTION? - GBS is commonly found in the digestive system and the vagina. In healthy adults, GBS is not harmful and does not cause problems. But in pregnant women and newborn infants, being infected with GBS can cause serious illness. Approximately one in three to four pregnant women in the [place] carries GBS in their gastrointestinal system and/or in their vagina. Carrying GBS is not the same as being infected. Carriers are not sick and do not need treatment during pregnancy. There is no treatment that can stop you from carrying GBS.  Pregnant women who are carriers of GBS infrequently become infected with GBS. GBS can cause urinary tract infections, infection of the amniotic fluid (bag of water), and infection of the uterus after delivery. GBS infections during pregnancy may lead to preterm labor.  Pregnant women who carry GBS can pass on the bacteria to their newborns, and some of those babies become infected with GBS. Newborns who are infected with GBS can develop pneumonia (lung infection), septicemia (blood infection), or  meningitis (infection of the lining of the brain and spinal cord). These complications can be prevented by giving intravenous antibiotics during labor to any woman who is at risk of GBS infection. You are at risk of GBS infection if: You have a urine culture during your current pregnancy showing GBS  You have a vaginal and rectal culture during your current pregnancy showing GBS  You had an infant infected with GBS in the past GROUP B STREP PREVENTION - Most doctors and nurses recommend a urine culture early in your pregnancy to be sure that you do not have a bladder infection without symptoms. If you urine culture shows GBS or other bacteria, you may be treated with an antibiotic. If you have symptoms of urinary infection, such as pain with urination, any time during your pregnancy, a urine culture is done. If GBS grows from the urine culture, it should be treated with an antibiotic, and you should also receive intravenous antibiotics during labor. Expert groups recommend that all pregnant women have a GBS culture at 35 to 37 weeks of pregnancy. The culture is done by swabbing the vagina and rectum. If your GBS culture is positive, you will be given an intravenous antibiotic during labor. If you have preterm labor, the culture is done then and an intravenous antibiotic is given until the baby is born or the labor is stopped by your health care provider. If you have a positive GBS culture and you have an allergy to penicillin, be sure your doctor and nurse are aware of this allergy and tell them what happened with the allergy. If you had only a rash or itching, this   is not a serious allergy, and you can receive a common drug related to the penicillin. If you had a serious allergy (for example, trouble breathing, swelling of your face) you may need an additional test to determine which antibiotic should be used during labor. Being treated with an antibiotic during labor greatly reduces the chance that you or  your newborn will develop infections related to GBS. It is important to note that young infants up to age 1 months can also develop septicemia, meningitis and other serious infections from GBS. Being treated with an antibiotic during labor does not reduce the chance that your baby will develop this later type of infection. There is currently no known way of preventing this later-onset GBS disease. WHERE TO GET MORE INFORMATION - Your healthcare provider is the best source of information for questions and concerns related to your medical problem. Natural Childbirth Natural childbirth is going through labor and delivery without any drugs to relieve pain. You also do not use fetal monitors, have a cesarean delivery, or get a sugical cut to enlarge the vaginal opening (episiotomy). With the help of a birthing professional (midwife), you will direct your own labor and delivery as you choose. Many women chose natural childbirth because they feel more in control and in touch with their labor and delivery. They are also concerned about the medications affecting themselves and the baby. Pregnant women with a high risk pregnancy should not attempt natural childbirth. It is better to deliver the infant in a hospital if an emergency situation arises. Sometimes, the caregiver has to intervene for the health and safety of the mother and infant. TWO TECHNIQUES FOR NATURAL CHILDBIRTH:   The Lamaze method. This method teaches women that having a baby is normal, healthy, and natural. It also teaches the mother to take a neutral position regarding pain medication and anesthesia and to make an informed decision if and when it is right for them.  The Erven Colla (also called husband coached birth). This method teaches the father to be the birth coach and stresses a natural approach. It also encourages exercise and a balanced diet with good nutrition. The exercises teach relaxation and deep breathing techniques. However,  there are also classes to prepare the parents for an emergency situation that may occur. METHODS OF DEALING WITH LABOR PAIN AND DELIVERY:  Meditation.  Yoga.  Hypnosis.  Acupuncture.  Massage.  Changing positions (walking, rocking, showering, leaning on birth balls).  Lying in warm water or a jacuzzi.  Find an activity that keeps your mind off of the labor pain.  Listen to soft music.  Visual imagery (focus on a particular object). BEFORE GOING INTO LABOR  Be sure you and your spouse/partner are in agreement to have natural childbirth.  Decide if your caregiver or a midwife will deliver your baby.  Decide if you will have your baby in the hospital, birthing center, or at home.  If you have children, make plans to have someone to take care of them when you go to the hospital.  Know the distance and the time it takes to go to the delivery center. Make a dry run to be sure.  Have a bag packed with a night gown, bathrobe, and toiletries ready to take when you go into labor.  Keep phone numbers of your family and friends handy if you need to call someone when you go into labor.  Your spouse or partner should go to all the teaching classes.  Talk with  your caregiver about the possibility of a medical emergency and what will happen if that occurs. ADVANTAGES OF NATURAL CHILDBIRTH  You are in control of your labor and delivery.  It is safe.  There are no medications or anesthetics that may affect you and the fetus.  There are no invasive procedures such as an episiotomy.  You and your partner will work together, which can increase your bond.  Meditation, yoga, massage, and breathing exercises can be learned while pregnant and help you when you are in labor and at delivery.  In most delivery centers, the family and friends can be involved in the labor and delivery process. DISADVANTAGES OF NATURAL CHILDBIRTH  You will experience pain during your labor and  delivery.  The methods of helping relieve your labor pains may not work for you.  You may feel embarrassed, disappointed, and like a failure if you decide to change your mind during labor and not have natural childbirth. AFTER THE DELIVERY  You will be very tired.  You will be uncomfortable because of your uterus contracting. You will feel soreness around the vagina.  You may feel cold and shaky.This is a natural reaction.  You will be excited, overwhelmed, accomplished, and proud to be a mother. HOME CARE INSTRUCTIONS   Follow the advice and instructions of your caregiver.  Follow the instructions of your natural childbirth instructor (Lamaze or Bradley Method). Document Released: 02/17/2008 Document Revised: 05/29/2011 Document Reviewed: 02/17/2008 [facility] Patient Information 2014 [place], [place].

## 2013-01-15 NOTE — Progress Notes (Signed)
HR - 87 Routine OB visit, denies any concerns at this time

## 2013-01-16 LAB — GC/CHLAMYDIA PROBE AMP: CT Probe RNA: NEGATIVE

## 2013-01-30 ENCOUNTER — Ambulatory Visit (INDEPENDENT_AMBULATORY_CARE_PROVIDER_SITE_OTHER): Payer: Managed Care, Other (non HMO) | Admitting: Obstetrics & Gynecology

## 2013-01-30 VITALS — BP 95/64 | Temp 97.8°F | Wt 182.4 lb

## 2013-01-30 DIAGNOSIS — Z3400 Encounter for supervision of normal first pregnancy, unspecified trimester: Secondary | ICD-10-CM

## 2013-01-30 DIAGNOSIS — Z3403 Encounter for supervision of normal first pregnancy, third trimester: Secondary | ICD-10-CM

## 2013-01-30 LAB — POCT URINALYSIS DIPSTICK
Bilirubin, UA: NEGATIVE
Blood, UA: NEGATIVE
Glucose, UA: NEGATIVE
Ketones, UA: NEGATIVE
Leukocytes, UA: NEGATIVE
Nitrite, UA: NEGATIVE
pH, UA: 8

## 2013-01-30 NOTE — Progress Notes (Signed)
Pulse- 88 Doing well. 

## 2013-01-31 ENCOUNTER — Encounter: Payer: Self-pay | Admitting: Obstetrics & Gynecology

## 2013-02-06 ENCOUNTER — Ambulatory Visit (INDEPENDENT_AMBULATORY_CARE_PROVIDER_SITE_OTHER): Payer: Managed Care, Other (non HMO) | Admitting: Obstetrics

## 2013-02-06 ENCOUNTER — Encounter: Payer: Self-pay | Admitting: Obstetrics

## 2013-02-06 VITALS — BP 116/72 | Temp 97.5°F | Wt 183.0 lb

## 2013-02-06 DIAGNOSIS — B373 Candidiasis of vulva and vagina: Secondary | ICD-10-CM | POA: Insufficient documentation

## 2013-02-06 DIAGNOSIS — Z3403 Encounter for supervision of normal first pregnancy, third trimester: Secondary | ICD-10-CM

## 2013-02-06 DIAGNOSIS — Z3400 Encounter for supervision of normal first pregnancy, unspecified trimester: Secondary | ICD-10-CM

## 2013-02-06 DIAGNOSIS — B3731 Acute candidiasis of vulva and vagina: Secondary | ICD-10-CM | POA: Insufficient documentation

## 2013-02-06 LAB — POCT URINALYSIS DIPSTICK
Bilirubin, UA: NEGATIVE
Blood, UA: NEGATIVE
Glucose, UA: NEGATIVE
Leukocytes, UA: NEGATIVE
Nitrite, UA: NEGATIVE
Spec Grav, UA: 1.015
Urobilinogen, UA: NEGATIVE

## 2013-02-06 MED ORDER — FLUCONAZOLE 150 MG PO TABS: 150.0000 mg | ORAL_TABLET | Freq: Once | ORAL | Status: DC

## 2013-02-06 NOTE — Progress Notes (Signed)
HR - 92 Pt in office today for routine OB visit, states she feels like she has BV

## 2013-02-12 ENCOUNTER — Ambulatory Visit (INDEPENDENT_AMBULATORY_CARE_PROVIDER_SITE_OTHER): Payer: Managed Care, Other (non HMO) | Admitting: Obstetrics & Gynecology

## 2013-02-12 VITALS — BP 114/74 | Temp 98.6°F | Wt 185.0 lb

## 2013-02-12 DIAGNOSIS — Z3403 Encounter for supervision of normal first pregnancy, third trimester: Secondary | ICD-10-CM

## 2013-02-12 DIAGNOSIS — Z3400 Encounter for supervision of normal first pregnancy, unspecified trimester: Secondary | ICD-10-CM

## 2013-02-12 LAB — POCT URINALYSIS DIPSTICK
Blood, UA: NEGATIVE
Glucose, UA: NEGATIVE
Leukocytes, UA: NEGATIVE
Nitrite, UA: NEGATIVE
Protein, UA: NEGATIVE
Spec Grav, UA: <=1.005
Urobilinogen, UA: NEGATIVE
pH, UA: 7

## 2013-02-12 NOTE — Progress Notes (Signed)
HR - 81 Pt in office for routine OB visit, denies any concerns at this time

## 2013-02-13 ENCOUNTER — Encounter: Payer: Self-pay | Admitting: Obstetrics & Gynecology

## 2013-02-19 ENCOUNTER — Ambulatory Visit (INDEPENDENT_AMBULATORY_CARE_PROVIDER_SITE_OTHER): Payer: Managed Care, Other (non HMO) | Admitting: Obstetrics & Gynecology

## 2013-02-19 ENCOUNTER — Encounter: Payer: Self-pay | Admitting: Obstetrics & Gynecology

## 2013-02-19 ENCOUNTER — Other Ambulatory Visit: Payer: Self-pay | Admitting: *Deleted

## 2013-02-19 VITALS — BP 121/77 | Temp 97.3°F | Wt 186.0 lb

## 2013-02-19 DIAGNOSIS — O480 Post-term pregnancy: Secondary | ICD-10-CM

## 2013-02-19 DIAGNOSIS — Z3403 Encounter for supervision of normal first pregnancy, third trimester: Secondary | ICD-10-CM

## 2013-02-19 DIAGNOSIS — Z3400 Encounter for supervision of normal first pregnancy, unspecified trimester: Secondary | ICD-10-CM

## 2013-02-19 LAB — POCT URINALYSIS DIPSTICK
Bilirubin, UA: NEGATIVE
Glucose, UA: NEGATIVE
Ketones, UA: NEGATIVE
Leukocytes, UA: NEGATIVE
Nitrite, UA: NEGATIVE
Urobilinogen, UA: NEGATIVE
pH, UA: 6

## 2013-02-19 NOTE — Patient Instructions (Signed)
Labor Induction  Labor induction is when steps are taken to cause a pregnant woman to begin the labor process. Most women go into labor on their own between 37 weeks and 42 weeks of the pregnancy. When this does not happen or when there is a medical need, methods may be used to induce labor. Labor induction causes a pregnant woman's uterus to contract. It also causes the cervix to soften (ripen), open (dilate), and thin out (efface). Usually, labor is not induced before 39 weeks of the pregnancy unless there is a problem with the baby or mother.  Before inducing labor, your health care provider will consider a number of factors, including the following:  The medical condition of you and the baby.   How many weeks along you are.   The status of the baby's lung maturity.   The condition of the cervix.   The position of the baby.  WHAT ARE THE REASONS FOR LABOR INDUCTION? Labor may be induced for the following reasons:  The health of the baby or mother is at risk.   The pregnancy is overdue by 1 week or more.   The water breaks but labor does not start on its own.   The mother has a health condition or serious illness, such as high blood pressure, infection, placental abruption, or diabetes.  The amniotic fluid amounts are low around the baby.   The baby is distressed.  Convenience or wanting the baby to be born on a certain date is not a reason for inducing labor. WHAT METHODS ARE USED FOR LABOR INDUCTION? Several methods of labor induction may be used, such as:   Prostaglandin medicine. This medicine causes the cervix to dilate and ripen. The medicine will also start contractions. It can be taken by mouth or by inserting a suppository into the vagina.   Inserting a thin tube (catheter) with a balloon on the end into the vagina to dilate the cervix. Once inserted, the balloon is expanded with water, which causes the cervix to open.   Stripping the membranes. Your health  care provider separates amniotic sac tissue from the cervix, causing the cervix to be stretched and causing the release of a hormone called progesterone. This may cause the uterus to contract. It is often done during an office visit. You will be sent home to wait for the contractions to begin. You will then come in for an induction.   Breaking the water. Your health care provider makes a hole in the amniotic sac using a small instrument. Once the amniotic sac breaks, contractions should begin. This may still take hours to see an effect.   Medicine to trigger or strengthen contractions. This medicine is given through an IV access tube inserted into a vein in your arm.  All of the methods of induction, besides stripping the membranes, will be done in the hospital. Induction is done in the hospital so that you and the baby can be carefully monitored.  HOW LONG DOES IT TAKE FOR LABOR TO BE INDUCED? Some inductions can take up to 2 3 days. Depending on the cervix, it usually takes less time. It takes longer when you are induced early in the pregnancy or if this is your first pregnancy. If a mother is still pregnant and the induction has been going on for 2 3 days, either the mother will be sent home or a cesarean delivery will be needed. WHAT ARE THE RISKS ASSOCIATED WITH LABOR INDUCTION? Some of the risks   of induction include:   Changes in fetal heart rate, such as too high, too low, or erratic.   Fetal distress.   Chance of infection for the mother and baby.   Increased chance of having a cesarean delivery.   Breaking off (abruption) of the placenta from the uterus (rare).   Uterine rupture (very rare).  When induction is needed for medical reasons, the benefits of induction may outweigh the risks. WHAT ARE SOME REASONS FOR NOT INDUCING LABOR? Labor induction should not be done if:   It is shown that your baby does not tolerate labor.   You have had previous surgeries on your  uterus, such as a myomectomy or the removal of fibroids.   Your placenta lies very low in the uterus and blocks the opening of the cervix (placenta previa).   Your baby is not in a head-down position.   The umbilical cord drops down into the birth canal in front of the baby. This could cut off the baby's blood and oxygen supply.   You have had a previous cesarean delivery.   There are unusual circumstances, such as the baby being extremely premature.  Document Released: 07/26/2006 Document Revised: 11/06/2012 Document Reviewed: 10/03/2012 [employer] Patient Information 2014 [employer].  

## 2013-02-19 NOTE — Progress Notes (Deleted)
Pulse 91    Subjective:    Lindsay Woodard is a 22 y.o. female being seen today for her obstetrical visit. She is at [redacted]w[redacted]d gestation. Patient reports no complaints. Fetal movement: normal.  Menstrual History: OB History   Grav Para Term Preterm Abortions TAB SAB Ect Mult Living   3    2  1          Menarche age: 28 Patient's last menstrual period was 05/14/2012.    {Common ambulatory SmartLinks:19316}  Review of Systems {ros; complete:30496}   Objective:    BP 121/77  Temp(Src) 97.3 F (36.3 C)  Wt 186 lb (84.369 kg)  LMP 05/14/2012 FHT:  *** BPM  Uterine Size: {fundal height:14540}  Presentations: {fetal pos:14558}  Pelvic Exam:              Dilation: {dilation:14561}       Effacement: {effacement:14523}   Station:  {station:14562}     Consistency: {firm/med/soft:14563}            Position: {post/mid/ant:14564}     Assessment:    Pregnancy {numbers; 0-42:17906} and {numbers; 0-7:15237}/7 weeks   Plan:    Postdates management: {nst:14568}. Induction: {induction:14569}.  Follow up in ***.

## 2013-02-20 ENCOUNTER — Inpatient Hospital Stay (HOSPITAL_COMMUNITY)
Admission: AD | Admit: 2013-02-20 | Discharge: 2013-02-20 | Disposition: A | Discharge status: Home / Self Care | Payer: Managed Care, Other (non HMO) | Source: Ambulatory Visit | Attending: Obstetrics & Gynecology | Admitting: Obstetrics & Gynecology

## 2013-02-20 ENCOUNTER — Encounter (HOSPITAL_COMMUNITY): Payer: Self-pay

## 2013-02-20 ENCOUNTER — Telehealth (HOSPITAL_COMMUNITY): Payer: Self-pay | Admitting: *Deleted

## 2013-02-20 DIAGNOSIS — O479 False labor, unspecified: Secondary | ICD-10-CM | POA: Insufficient documentation

## 2013-02-20 HISTORY — DX: Other specified postprocedural states: Z98.890

## 2013-02-20 HISTORY — DX: Nausea with vomiting, unspecified: R11.2

## 2013-02-20 NOTE — MAU Note (Signed)
Pt states uc all day long and are now every 5 mins apart. Denies vaginal bleeding. States she has been leaking "something". +FM.

## 2013-02-20 NOTE — MAU Note (Signed)
Ivonne Andrew CNM in pt room to r/o SROM by speculum exam

## 2013-02-20 NOTE — Telephone Encounter (Signed)
Preadmission screen  

## 2013-02-21 ENCOUNTER — Encounter (HOSPITAL_COMMUNITY): Payer: Self-pay

## 2013-02-21 ENCOUNTER — Ambulatory Visit (HOSPITAL_COMMUNITY)
Admission: RE | Admit: 2013-02-21 | Discharge: 2013-02-21 | Disposition: A | Discharge status: Home / Self Care | Payer: Managed Care, Other (non HMO) | Source: Ambulatory Visit | Attending: Obstetrics & Gynecology | Admitting: Obstetrics & Gynecology

## 2013-02-21 ENCOUNTER — Inpatient Hospital Stay (HOSPITAL_COMMUNITY)
Admission: AD | Admit: 2013-02-21 | Discharge: 2013-02-24 | DRG: 775 | Disposition: A | Discharge status: Home / Self Care | Payer: Managed Care, Other (non HMO) | Source: Ambulatory Visit | Attending: Obstetrics | Admitting: Obstetrics

## 2013-02-21 DIAGNOSIS — Z3400 Encounter for supervision of normal first pregnancy, unspecified trimester: Secondary | ICD-10-CM

## 2013-02-21 DIAGNOSIS — M549 Dorsalgia, unspecified: Secondary | ICD-10-CM

## 2013-02-21 DIAGNOSIS — O480 Post-term pregnancy: Secondary | ICD-10-CM | POA: Diagnosis present

## 2013-02-21 DIAGNOSIS — M5430 Sciatica, unspecified side: Secondary | ICD-10-CM

## 2013-02-21 LAB — CBC
MCHC: 35.7 g/dL (ref 30.0–36.0)
Platelets: 245 10*3/uL (ref 150–400)
RBC: 4.31 MIL/uL (ref 3.87–5.11)
WBC: 10.4 10*3/uL (ref 4.0–10.5)

## 2013-02-21 LAB — TYPE AND SCREEN: Antibody Screen: NEGATIVE

## 2013-02-21 MED ORDER — LACTATED RINGERS IV SOLN
INTRAVENOUS | Status: DC
  Administered 2013-02-21 – 2013-02-22 (×3): via INTRAVENOUS

## 2013-02-21 MED ORDER — BUTORPHANOL TARTRATE 1 MG/ML IJ SOLN: 1.0000 mg | INTRAMUSCULAR | Status: DC | PRN

## 2013-02-21 MED ORDER — BUTORPHANOL TARTRATE 1 MG/ML IJ SOLN
1.0000 mg | INTRAMUSCULAR | Status: DC | PRN
  Administered 2013-02-22: 1 mg via INTRAVENOUS
  Filled 2013-02-21: qty 1

## 2013-02-21 MED ORDER — CITRIC ACID-SODIUM CITRATE 334-500 MG/5ML PO SOLN: 30.0000 mL | ORAL | Status: DC | PRN

## 2013-02-21 MED ORDER — MISOPROSTOL 25 MCG QUARTER TABLET
25.0000 ug | ORAL_TABLET | ORAL | Status: DC | PRN
  Administered 2013-02-21 – 2013-02-22 (×3): 25 ug via VAGINAL
  Filled 2013-02-21: qty 1
  Filled 2013-02-21 (×3): qty 0.25

## 2013-02-21 MED ORDER — ZOLPIDEM TARTRATE 5 MG PO TABS
5.0000 mg | ORAL_TABLET | Freq: Every evening | ORAL | Status: DC | PRN
  Administered 2013-02-21: 5 mg via ORAL
  Filled 2013-02-21: qty 1

## 2013-02-21 MED ORDER — OXYTOCIN 40 UNITS IN LACTATED RINGERS INFUSION - SIMPLE MED: 62.5000 mL/h | INTRAVENOUS | Status: DC

## 2013-02-21 MED ORDER — PROMETHAZINE HCL 25 MG/ML IJ SOLN
12.5000 mg | Freq: Four times a day (QID) | INTRAMUSCULAR | Status: DC | PRN
  Administered 2013-02-22: 12.5 mg via INTRAVENOUS
  Filled 2013-02-21: qty 1

## 2013-02-21 MED ORDER — LACTATED RINGERS IV SOLN
500.0000 mL | Freq: Once | INTRAVENOUS | Status: AC
  Administered 2013-02-22: 500 mL via INTRAVENOUS

## 2013-02-21 MED ORDER — FENTANYL 2.5 MCG/ML BUPIVACAINE 1/10 % EPIDURAL INFUSION (WH - ANES)
14.0000 mL/h | INTRAMUSCULAR | Status: DC | PRN
  Administered 2013-02-22 (×3): 14 mL/h via EPIDURAL
  Filled 2013-02-21 (×3): qty 125

## 2013-02-21 MED ORDER — PHENYLEPHRINE 40 MCG/ML (10ML) SYRINGE FOR IV PUSH (FOR BLOOD PRESSURE SUPPORT)
80.0000 ug | PREFILLED_SYRINGE | INTRAVENOUS | Status: DC | PRN
  Filled 2013-02-21: qty 10
  Filled 2013-02-21: qty 2

## 2013-02-21 MED ORDER — ONDANSETRON HCL 4 MG/2ML IJ SOLN
4.0000 mg | Freq: Four times a day (QID) | INTRAMUSCULAR | Status: DC | PRN
  Administered 2013-02-22: 4 mg via INTRAVENOUS
  Filled 2013-02-21: qty 2

## 2013-02-21 MED ORDER — OXYCODONE-ACETAMINOPHEN 5-325 MG PO TABS: 1.0000 | ORAL_TABLET | ORAL | Status: DC | PRN

## 2013-02-21 MED ORDER — DIPHENHYDRAMINE HCL 50 MG/ML IJ SOLN: 12.5000 mg | INTRAMUSCULAR | Status: DC | PRN

## 2013-02-21 MED ORDER — TERBUTALINE SULFATE 1 MG/ML IJ SOLN: 0.2500 mg | Freq: Once | INTRAMUSCULAR | Status: AC | PRN

## 2013-02-21 MED ORDER — ACETAMINOPHEN 325 MG PO TABS: 650.0000 mg | ORAL_TABLET | ORAL | Status: DC | PRN

## 2013-02-21 MED ORDER — ONDANSETRON HCL 4 MG/2ML IJ SOLN: 4.0000 mg | Freq: Four times a day (QID) | INTRAMUSCULAR | Status: DC | PRN

## 2013-02-21 MED ORDER — MISOPROSTOL 25 MCG QUARTER TABLET: 25.0000 ug | ORAL_TABLET | ORAL | Status: DC | PRN

## 2013-02-21 MED ORDER — EPHEDRINE 5 MG/ML INJ
10.0000 mg | INTRAVENOUS | Status: DC | PRN
  Filled 2013-02-21: qty 2

## 2013-02-21 MED ORDER — OXYTOCIN BOLUS FROM INFUSION: 500.0000 mL | INTRAVENOUS | Status: DC

## 2013-02-21 MED ORDER — EPHEDRINE 5 MG/ML INJ
10.0000 mg | INTRAVENOUS | Status: DC | PRN
  Filled 2013-02-21: qty 2
  Filled 2013-02-21: qty 4

## 2013-02-21 MED ORDER — IBUPROFEN 600 MG PO TABS
600.0000 mg | ORAL_TABLET | Freq: Four times a day (QID) | ORAL | Status: DC | PRN
  Administered 2013-02-23: 600 mg via ORAL
  Filled 2013-02-21: qty 1

## 2013-02-21 MED ORDER — IBUPROFEN 600 MG PO TABS: 600.0000 mg | ORAL_TABLET | Freq: Four times a day (QID) | ORAL | Status: DC | PRN

## 2013-02-21 MED ORDER — FLEET ENEMA 7-19 GM/118ML RE ENEM: 1.0000 | ENEMA | RECTAL | Status: DC | PRN

## 2013-02-21 MED ORDER — LACTATED RINGERS IV SOLN: 500.0000 mL | INTRAVENOUS | Status: DC | PRN

## 2013-02-21 MED ORDER — LIDOCAINE HCL (PF) 1 % IJ SOLN
30.0000 mL | INTRAMUSCULAR | Status: DC | PRN
  Filled 2013-02-21 (×3): qty 30

## 2013-02-21 MED ORDER — TERBUTALINE SULFATE 1 MG/ML IJ SOLN: 0.2500 mg | Freq: Once | INTRAMUSCULAR | Status: DC | PRN

## 2013-02-21 MED ORDER — LACTATED RINGERS IV SOLN
500.0000 mL | INTRAVENOUS | Status: DC | PRN
  Administered 2013-02-22: 500 mL via INTRAVENOUS

## 2013-02-21 MED ORDER — ACETAMINOPHEN 325 MG PO TABS
650.0000 mg | ORAL_TABLET | ORAL | Status: DC | PRN
Start: 2013-02-21 — End: 2013-02-24
  Administered 2013-02-22: 650 mg via ORAL
  Filled 2013-02-21: qty 2

## 2013-02-21 MED ORDER — OXYTOCIN 40 UNITS IN LACTATED RINGERS INFUSION - SIMPLE MED
62.5000 mL/h | INTRAVENOUS | Status: DC
  Filled 2013-02-21: qty 1000

## 2013-02-21 MED ORDER — LACTATED RINGERS IV SOLN: INTRAVENOUS | Status: DC

## 2013-02-21 MED ORDER — OXYCODONE-ACETAMINOPHEN 5-325 MG PO TABS
1.0000 | ORAL_TABLET | ORAL | Status: DC | PRN
  Administered 2013-02-22: 1 via ORAL
  Filled 2013-02-21 (×3): qty 1

## 2013-02-21 MED ORDER — HYDROXYZINE HCL 50 MG PO TABS: 50.0000 mg | ORAL_TABLET | Freq: Four times a day (QID) | ORAL | Status: DC | PRN

## 2013-02-21 MED ORDER — LIDOCAINE HCL (PF) 1 % IJ SOLN: 30.0000 mL | INTRAMUSCULAR | Status: DC | PRN

## 2013-02-21 MED ORDER — OXYTOCIN BOLUS FROM INFUSION
500.0000 mL | INTRAVENOUS | Status: DC
  Administered 2013-02-22: 500 mL via INTRAVENOUS

## 2013-02-21 MED ORDER — PHENYLEPHRINE 40 MCG/ML (10ML) SYRINGE FOR IV PUSH (FOR BLOOD PRESSURE SUPPORT)
80.0000 ug | PREFILLED_SYRINGE | INTRAVENOUS | Status: DC | PRN
  Filled 2013-02-21: qty 2

## 2013-02-22 ENCOUNTER — Inpatient Hospital Stay (HOSPITAL_COMMUNITY): Payer: Managed Care, Other (non HMO) | Admitting: Anesthesiology

## 2013-02-22 ENCOUNTER — Encounter (HOSPITAL_COMMUNITY): Payer: Managed Care, Other (non HMO) | Admitting: Anesthesiology

## 2013-02-22 LAB — RPR: RPR Ser Ql: NONREACTIVE

## 2013-02-22 MED ORDER — LACTATED RINGERS IV SOLN
INTRAVENOUS | Status: DC
  Administered 2013-02-22: 13:00:00 via INTRAUTERINE

## 2013-02-22 MED ORDER — OXYTOCIN 40 UNITS IN LACTATED RINGERS INFUSION - SIMPLE MED
1.0000 m[IU]/min | INTRAVENOUS | Status: DC
  Administered 2013-02-22: 2 m[IU]/min via INTRAVENOUS

## 2013-02-22 MED ORDER — TERBUTALINE SULFATE 1 MG/ML IJ SOLN: 0.2500 mg | Freq: Once | INTRAMUSCULAR | Status: AC | PRN

## 2013-02-22 MED ORDER — LIDOCAINE HCL (PF) 1 % IJ SOLN
INTRAMUSCULAR | Status: DC | PRN
  Administered 2013-02-22 (×4): 4 mL

## 2013-02-22 MED ORDER — OXYTOCIN 40 UNITS IN LACTATED RINGERS INFUSION - SIMPLE MED: 1.0000 m[IU]/min | INTRAVENOUS | Status: DC

## 2013-02-22 NOTE — Progress Notes (Signed)
Dr Gaynell Face updated on sve unchanged, muv's adequate, repetitive vbls, amnioinfusion started. Order to d/c pitocin and allow uterus to rest and continue amnioinfusion.

## 2013-02-22 NOTE — Progress Notes (Signed)
Outside hall to meet dr Gaynell Face and have him view fhr, he has been requested to first room to induction of labor that has arived 8cm. This pt sitting up High fowlers and vbls have seem to resolved. Will notify dr Gaynell Face as soon as he is available.

## 2013-02-22 NOTE — Progress Notes (Signed)
Attempted to speak to dr Gaynell Face via phone regarding variables after starting pitocin, he stated to RN, he was en route to see all patients within minutes.

## 2013-02-22 NOTE — H&P (Signed)
This is Dr. Francoise Ceo dictating the history and physical on  Lindsay Woodard she's a 22 year old gravida 3 para 00-0 at 40 weeks and 4 days followed by MFM negative GBS and she had an ultrasound yesterday that showed 52.3 and a recommended delivery cervix was 1 cm and 50% so she was admitted and had Cytotec x2 last night and she is now 1 cm 70 started on Pitocin this a.m. She is contracting and she has an epidural Past medical history negative Past surgical history negative Social history negative System review negative Physical exam well-developed female in no distress HEENT negative Lungs clear to P&A Heart regular rhythm no murmurs no gallops Breasts negative Abdomen term pelvic as described above

## 2013-02-22 NOTE — Progress Notes (Signed)
Dr Gaynell Face updated via phone re: sve, decreased variability, early decels, mvu's, pitocin level. To give amnioinfusion prn per order.

## 2013-02-22 NOTE — Progress Notes (Signed)
Dr Gaynell Face at bs reviewing fhr tracing and performing sve.

## 2013-02-22 NOTE — Anesthesia Preprocedure Evaluation (Signed)
Anesthesia Evaluation  Patient identified by MRN, date of birth, ID band Patient awake    Reviewed: Allergy & Precautions, H&P , NPO status , Patient's Chart, lab work & pertinent test results, reviewed documented beta blocker date and time   History of Anesthesia Complications (+) PONV  Airway Mallampati: III TM Distance: >3 FB Neck ROM: full    Dental  (+) Teeth Intact   Pulmonary neg pulmonary ROS,  breath sounds clear to auscultation        Cardiovascular negative cardio ROS  Rhythm:regular Rate:Normal     Neuro/Psych  Neuromuscular disease (back pain and sciatica) negative psych ROS   GI/Hepatic negative GI ROS, Neg liver ROS,   Endo/Other  BMI 36.4  Renal/GU negative Renal ROS     Musculoskeletal   Abdominal   Peds  Hematology negative hematology ROS (+)   Anesthesia Other Findings   Reproductive/Obstetrics (+) Pregnancy                           Anesthesia Physical Anesthesia Plan  ASA: II  Anesthesia Plan: Epidural   Post-op Pain Management:    Induction:   Airway Management Planned:   Additional Equipment:   Intra-op Plan:   Post-operative Plan:   Informed Consent: I have reviewed the patients History and Physical, chart, labs and discussed the procedure including the risks, benefits and alternatives for the proposed anesthesia with the patient or authorized representative who has indicated his/her understanding and acceptance.     Plan Discussed with:   Anesthesia Plan Comments:         Anesthesia Quick Evaluation

## 2013-02-22 NOTE — Anesthesia Procedure Notes (Signed)
Epidural Patient location during procedure: OB Start time: 02/22/2013 5:50 AM  Staffing Performed by: anesthesiologist   Preanesthetic Checklist Completed: patient identified, site marked, surgical consent, pre-op evaluation, timeout performed, IV checked, risks and benefits discussed and monitors and equipment checked  Epidural Patient position: sitting Prep: site prepped and draped and DuraPrep Patient monitoring: continuous pulse ox and blood pressure Approach: midline Injection technique: LOR air  Needle:  Needle type: Tuohy  Needle gauge: 17 G Needle length: 9 cm and 9 Needle insertion depth: 5.5 cm Catheter type: closed end flexible Catheter size: 19 Gauge Catheter at skin depth: 10.5 cm Test dose: negative  Assessment Events: blood not aspirated, injection not painful, no injection resistance, negative IV test and no paresthesia  Additional Notes Discussed risk of headache, infection, bleeding, nerve injury and failed or incomplete block.  Patient voices understanding and wishes to proceed.  Epidural placed easily on first attempt.  No paresthesia.  Patient tolerated procedure well with no apparent complications.  Jasmine December, MDReason for block:procedure for pain

## 2013-02-22 NOTE — Progress Notes (Signed)
Dr Gaynell Face updated on sve 8cm/100/0.+1, pitocin increased, fhr with vbl decels and pitocin decreased. Pt position changed. Dr Gaynell Face notified amnioinsfusion d/c'd after no return for period of time.

## 2013-02-23 ENCOUNTER — Encounter (HOSPITAL_COMMUNITY): Payer: Self-pay | Admitting: *Deleted

## 2013-02-23 LAB — CBC
HCT: 36.4 % (ref 36.0–46.0)
MCH: 32.1 pg (ref 26.0–34.0)
MCHC: 35.4 g/dL (ref 30.0–36.0)
MCV: 90.5 fL (ref 78.0–100.0)
RDW: 12.4 % (ref 11.5–15.5)

## 2013-02-23 MED ORDER — BENZOCAINE-MENTHOL 20-0.5 % EX AERO: 1.0000 "application " | INHALATION_SPRAY | CUTANEOUS | Status: DC | PRN

## 2013-02-23 MED ORDER — LANOLIN HYDROUS EX OINT: TOPICAL_OINTMENT | CUTANEOUS | Status: DC | PRN

## 2013-02-23 MED ORDER — ZOLPIDEM TARTRATE 5 MG PO TABS: 5.0000 mg | ORAL_TABLET | Freq: Every evening | ORAL | Status: DC | PRN

## 2013-02-23 MED ORDER — SENNOSIDES-DOCUSATE SODIUM 8.6-50 MG PO TABS
2.0000 | ORAL_TABLET | ORAL | Status: DC
  Administered 2013-02-23: 2 via ORAL
  Filled 2013-02-23: qty 2

## 2013-02-23 MED ORDER — SIMETHICONE 80 MG PO CHEW: 80.0000 mg | CHEWABLE_TABLET | ORAL | Status: DC | PRN

## 2013-02-23 MED ORDER — FERROUS SULFATE 325 (65 FE) MG PO TABS
325.0000 mg | ORAL_TABLET | Freq: Two times a day (BID) | ORAL | Status: DC
  Administered 2013-02-23 – 2013-02-24 (×2): 325 mg via ORAL
  Filled 2013-02-23 (×2): qty 1

## 2013-02-23 MED ORDER — IBUPROFEN 600 MG PO TABS
600.0000 mg | ORAL_TABLET | Freq: Four times a day (QID) | ORAL | Status: DC
  Administered 2013-02-23 – 2013-02-24 (×5): 600 mg via ORAL
  Filled 2013-02-23 (×5): qty 1

## 2013-02-23 MED ORDER — DIBUCAINE 1 % RE OINT: 1.0000 "application " | TOPICAL_OINTMENT | RECTAL | Status: DC | PRN

## 2013-02-23 MED ORDER — TETANUS-DIPHTH-ACELL PERTUSSIS 5-2.5-18.5 LF-MCG/0.5 IM SUSP: 0.5000 mL | Freq: Once | INTRAMUSCULAR | Status: DC

## 2013-02-23 MED ORDER — DIPHENHYDRAMINE HCL 25 MG PO CAPS: 25.0000 mg | ORAL_CAPSULE | Freq: Four times a day (QID) | ORAL | Status: DC | PRN

## 2013-02-23 MED ORDER — ONDANSETRON HCL 4 MG/2ML IJ SOLN: 4.0000 mg | INTRAMUSCULAR | Status: DC | PRN

## 2013-02-23 MED ORDER — PRENATAL MULTIVITAMIN CH
1.0000 | ORAL_TABLET | Freq: Every day | ORAL | Status: DC
  Administered 2013-02-23: 1 via ORAL
  Filled 2013-02-23: qty 1

## 2013-02-23 MED ORDER — ONDANSETRON HCL 4 MG PO TABS: 4.0000 mg | ORAL_TABLET | ORAL | Status: DC | PRN

## 2013-02-23 MED ORDER — OXYCODONE-ACETAMINOPHEN 5-325 MG PO TABS
1.0000 | ORAL_TABLET | ORAL | Status: DC | PRN
  Administered 2013-02-23 (×2): 1 via ORAL

## 2013-02-23 MED ORDER — WITCH HAZEL-GLYCERIN EX PADS: 1.0000 "application " | MEDICATED_PAD | CUTANEOUS | Status: DC | PRN

## 2013-02-23 NOTE — Progress Notes (Signed)
Patient ID: Lindsay Woodard, female   DOB: Jul 19, 1990, 22 y.o.   MRN: [id number] Postpartum day one vital signs normal Fundus firm Lochia moderate Legs negative doing well

## 2013-02-23 NOTE — Anesthesia Postprocedure Evaluation (Addendum)
  Anesthesia Post-op Note  Patient: Lindsay Woodard  Procedure(s) Performed: * No procedures listed *  Patient Location: Mother Baby  Anesthesia Type:Epidural  Level of Consciousness: awake and alert   Airway and Oxygen Therapy: Patient Spontanous Breathing  Post-op Pain: mild  Post-op Assessment: Patient's Cardiovascular Status Stable, Respiratory Function Stable, No signs of Nausea or vomiting, Pain level controlled, No headache, No residual numbness and No residual motor weakness  Post-op Vital Signs: stable  Complications: No apparent anesthesia complications

## 2013-02-24 MED ORDER — IBUPROFEN 600 MG PO TABS: 600.0000 mg | ORAL_TABLET | Freq: Four times a day (QID) | ORAL | Status: AC | PRN

## 2013-02-24 MED ORDER — OXYCODONE-ACETAMINOPHEN 5-325 MG PO TABS: 1.0000 | ORAL_TABLET | ORAL | Status: AC | PRN

## 2013-02-24 NOTE — Progress Notes (Signed)
Post Partum Day 2 Subjective: no complaints  Objective: Blood pressure 106/68, pulse 77, temperature 98 F (36.7 C), temperature source Oral, resp. rate 18, height 5' (1.524 m), weight 186 lb (84.369 kg), last menstrual period 05/14/2012, SpO2 98.00%, unknown if currently breastfeeding.  Physical Exam:  General: alert and no distress Lochia: appropriate Uterine Fundus: firm Incision: healing well DVT Evaluation: No evidence of DVT seen on physical exam.   Recent Labs  02/21/13 1800 02/23/13 0612  HGB 13.9 12.9  HCT 38.9 36.4    Assessment/Plan: Discharge home   LOS: 3 days   Denny Lave A 02/24/2013, 8:36 AM

## 2013-02-24 NOTE — Discharge Summary (Signed)
Obstetric Discharge Summary Reason for Admission: onset of labor Prenatal Procedures: ultrasound Intrapartum Procedures: spontaneous vaginal delivery Postpartum Procedures: none Complications-Operative and Postpartum: none Hemoglobin  Date Value Range Status  02/23/2013 12.9  12.0 - 15.0 g/dL Final     HCT  Date Value Range Status  02/23/2013 36.4  36.0 - 46.0 % Final    Physical Exam:  General: alert and no distress Lochia: appropriate Uterine Fundus: firm Incision: healing well DVT Evaluation: No evidence of DVT seen on physical exam.  Discharge Diagnoses: Term Pregnancy-delivered  Discharge Information: Date: 02/24/2013 Activity: pelvic rest Diet: routine Medications: PNV, Ibuprofen, Colace and Percocet Condition: stable Instructions: refer to practice specific booklet Discharge to: home Follow-up Information   Follow up with Antionette Char A, MD. Schedule an appointment as soon as possible for a visit in 2 weeks.   Specialty:  Obstetrics and Gynecology   Contact information:   [address] [place] [address] [phone]       Newborn Data: Live born female  Birth Weight: 6 lb 3.5 oz (2821 g) APGAR: 7, 9  Home with mother.  Lindsay Woodard A 02/24/2013, 8:42 AM

## 2013-02-25 ENCOUNTER — Inpatient Hospital Stay (HOSPITAL_COMMUNITY): Admission: RE | Admit: 2013-02-25 | Payer: Managed Care, Other (non HMO) | Source: Ambulatory Visit

## 2013-03-03 ENCOUNTER — Ambulatory Visit (HOSPITAL_COMMUNITY)
Admit: 2013-03-03 | Discharge: 2013-03-03 | Disposition: A | Discharge status: Home / Self Care | Payer: Managed Care, Other (non HMO) | Attending: Obstetrics & Gynecology | Admitting: Obstetrics & Gynecology

## 2013-03-03 NOTE — Lactation Note (Signed)
Adult Lactation Consultation Outpatient Visit Note   Patient Name: Lilybeth Vien Date of Birth: 1991-03-07 Gestational Age at Delivery: [redacted]w[redacted]d Type of Delivery: vag  Breastfeeding History: Frequency of Breastfeeding: 8-10 times Length of Feeding: 30 minutes Voids: 8 Stools: 8  Supplementing / Method: Pumping:  Type of Pump:medela   Frequency: 2 times in 24 hours  Volume:  3-4 ounces  Comments:    Consultation Evaluation:  Initial Feeding Assessment: Pre-feed Weight:2918 Post-feed OZHYQM:[id number] Amount Transferred:56 Comments:nipples are healing and mom reports no pain with this latch.  Prior to this she had initial pain when latching but an off-center latch stopped the pain.  Many audible swallows. Some reddened areas were also noted on the rt breast but there were no palpable nodules.  Advised mom to drain that breast well for the next few days and to call her OB if she developed any red streaks or flu like symptoms.  Additional Feeding Assessment: Pre-feed Weight: Post-feed Weight: Amount Transferred: Comments:  Additional Feeding Assessment: Pre-feed Weight: Post-feed Weight: Amount Transferred: Comments:  Total Breast milk Transferred this Visit:  Total Supplement Given:   Additional Interventions:   Follow-Up      Soyla Dryer 03/03/2013, 10:48 AM

## 2013-03-15 ENCOUNTER — Other Ambulatory Visit: Payer: Self-pay | Admitting: Obstetrics & Gynecology

## 2013-03-24 ENCOUNTER — Ambulatory Visit (INDEPENDENT_AMBULATORY_CARE_PROVIDER_SITE_OTHER): Payer: Managed Care, Other (non HMO) | Admitting: Obstetrics & Gynecology

## 2013-03-24 ENCOUNTER — Encounter: Payer: Self-pay | Admitting: Obstetrics & Gynecology

## 2013-03-24 MED ORDER — NORETHINDRONE 0.35 MG PO TABS: 1.0000 | ORAL_TABLET | Freq: Every day | ORAL | Status: DC

## 2013-03-24 NOTE — Patient Instructions (Signed)
Contraception Choices Contraception (birth control) is the use of any methods or devices to prevent pregnancy. Below are some methods to help avoid pregnancy. HORMONAL METHODS   Contraceptive implant This is a thin, plastic tube containing progesterone hormone. It does not contain estrogen hormone. Your health care provider inserts the tube in the inner part of the upper arm. The tube can remain in place for up to 3 years. After 3 years, the implant must be removed. The implant prevents the ovaries from releasing an egg (ovulation), thickens the cervical mucus to prevent sperm from entering the uterus, and thins the lining of the inside of the uterus.  Progesterone-only injections These injections are given every 3 months by your health care provider to prevent pregnancy. This synthetic progesterone hormone stops the ovaries from releasing eggs. It also thickens cervical mucus and changes the uterine lining. This makes it harder for sperm to survive in the uterus.  Birth control pills These pills contain estrogen and progesterone hormone. They work by preventing the ovaries from releasing eggs (ovulation). They also cause the cervical mucus to thicken, preventing the sperm from entering the uterus. Birth control pills are prescribed by a health care provider.Birth control pills can also be used to treat heavy periods.  Minipill This type of birth control pill contains only the progesterone hormone. They are taken every day of each month and must be prescribed by your health care provider.  Birth control patch The patch contains hormones similar to those in birth control pills. It must be changed once a week and is prescribed by a health care provider.  Vaginal ring The ring contains hormones similar to those in birth control pills. It is left in the vagina for 3 weeks, removed for 1 week, and then a new one is put back in place. The patient must be comfortable inserting and removing the ring from the  vagina.A health care provider's prescription is necessary.  Emergency contraception Emergency contraceptives prevent pregnancy after unprotected sexual intercourse. This pill can be taken right after sex or up to 5 days after unprotected sex. It is most effective the sooner you take the pills after having sexual intercourse. Most emergency contraceptive pills are available without a prescription. Check with your pharmacist. Do not use emergency contraception as your only form of birth control. BARRIER METHODS   Female condom This is a thin sheath (latex or rubber) that is worn over the penis during sexual intercourse. It can be used with spermicide to increase effectiveness.  Female condom. This is a soft, loose-fitting sheath that is put into the vagina before sexual intercourse.  Diaphragm This is a soft, latex, dome-shaped barrier that must be fitted by a health care provider. It is inserted into the vagina, along with a spermicidal jelly. It is inserted before intercourse. The diaphragm should be left in the vagina for 6 to 8 hours after intercourse.  Cervical cap This is a round, soft, latex or plastic cup that fits over the cervix and must be fitted by a health care provider. The cap can be left in place for up to 48 hours after intercourse.  Sponge This is a soft, circular piece of polyurethane foam. The sponge has spermicide in it. It is inserted into the vagina after wetting it and before sexual intercourse.  Spermicides These are chemicals that kill or block sperm from entering the cervix and uterus. They come in the form of creams, jellies, suppositories, foam, or tablets. They do not require a   prescription. They are inserted into the vagina with an applicator before having sexual intercourse. The process must be repeated every time you have sexual intercourse. INTRAUTERINE CONTRACEPTION  Intrauterine device (IUD) This is a T-shaped device that is put in a woman's uterus during a  menstrual period to prevent pregnancy. There are 2 types:  Copper IUD This type of IUD is wrapped in copper wire and is placed inside the uterus. Copper makes the uterus and fallopian tubes produce a fluid that kills sperm. It can stay in place for 10 years.  Hormone IUD This type of IUD contains the hormone progestin (synthetic progesterone). The hormone thickens the cervical mucus and prevents sperm from entering the uterus, and it also thins the uterine lining to prevent implantation of a fertilized egg. The hormone can weaken or kill the sperm that get into the uterus. It can stay in place for 3 5 years, depending on which type of IUD is used. PERMANENT METHODS OF CONTRACEPTION  Female tubal ligation This is when the woman's fallopian tubes are surgically sealed, tied, or blocked to prevent the egg from traveling to the uterus.  Hysteroscopic sterilization This involves placing a small coil or insert into each fallopian tube. Your doctor uses a technique called hysteroscopy to do the procedure. The device causes scar tissue to form. This results in permanent blockage of the fallopian tubes, so the sperm cannot fertilize the egg. It takes about 3 months after the procedure for the tubes to become blocked. You must use another form of birth control for these 3 months.  Female sterilization This is when the female has the tubes that carry sperm tied off (vasectomy).This blocks sperm from entering the vagina during sexual intercourse. After the procedure, the man can still ejaculate fluid (semen). NATURAL PLANNING METHODS  Natural family planning This is not having sexual intercourse or using a barrier method (condom, diaphragm, cervical cap) on days the woman could become pregnant.  Calendar method This is keeping track of the length of each menstrual cycle and identifying when you are fertile.  Ovulation method This is avoiding sexual intercourse during ovulation.  Symptothermal method This is  avoiding sexual intercourse during ovulation, using a thermometer and ovulation symptoms.  Post ovulation method This is timing sexual intercourse after you have ovulated. Regardless of which type or method of contraception you choose, it is important that you use condoms to protect against the transmission of sexually transmitted infections (STIs). Talk with your health care provider about which form of contraception is most appropriate for you. Document Released: 03/06/2005 Document Revised: 11/06/2012 Document Reviewed: 08/29/2012 [employer] Patient Information 2014 [employer].  

## 2013-03-24 NOTE — Progress Notes (Signed)
Subjective:     Lindsay Woodard is a 23 y.o. female who presents for a postpartum visit. She is 4 weeks postpartum following a spontaneous vaginal delivery. I have fully reviewed the prenatal and intrapartum course. The delivery was at 40 gestational weeks. Outcome: spontaneous vaginal delivery. Anesthesia: epidural. Postpartum course has been normal. Baby's course has been normal. Baby is feeding by breast. Bleeding minimal red. Bowel function is normal. Bladder function is normal. Patient is not sexually active. Contraception method is none. Postpartum depression screening: negative.  The following portions of the patient's history were reviewed and updated as appropriate: allergies, current medications, past family history, past medical history, past social history, past surgical history and problem list.  Review of Systems Pertinent items are noted in HPI.   Objective:    BP 107/69  Pulse 80  Temp(Src) 97.8 F (36.6 C)  Ht 5' (1.524 m)  Wt 169 lb (76.658 kg)  BMI 33.01 kg/m2        Assessment:     Normal postpartum exam.   Plan:     Contraception: oral progesterone-only contraceptive  Follow up in: 4 weeks or as needed.

## 2013-04-21 ENCOUNTER — Encounter: Payer: Self-pay | Admitting: Obstetrics & Gynecology

## 2013-04-21 ENCOUNTER — Ambulatory Visit (INDEPENDENT_AMBULATORY_CARE_PROVIDER_SITE_OTHER): Payer: Managed Care, Other (non HMO) | Admitting: Obstetrics & Gynecology

## 2013-04-21 VITALS — BP 112/74 | HR 86 | Temp 98.0°F | Ht 60.0 in | Wt 170.0 lb

## 2013-04-21 DIAGNOSIS — IMO0001 Reserved for inherently not codable concepts without codable children: Secondary | ICD-10-CM

## 2013-04-21 MED ORDER — NORETHINDRONE 0.35 MG PO TABS: 1.0000 | ORAL_TABLET | Freq: Every day | ORAL | Status: AC

## 2013-04-21 NOTE — Progress Notes (Signed)
Subjective:     Lindsay Woodard is a 23 y.o. female who presents for a postpartum visit. She is 8 weeks postpartum following a spontaneous vaginal delivery. I have fully reviewed the prenatal and intrapartum course. The delivery was at 40 gestational weeks. Outcome: spontaneous vaginal delivery. Anesthesia: epidural. Postpartum course has been WNL. Patient states she is having problems breast feeding. Baby's course has been WNL. Baby is feeding by both breast and bottle - Gerber Gentle. Bleeding Patient states cycle started yesterday. . Bowel function is normal. Bladder function is normal. Patient is sexually active. Contraception method is OCP (estrogen/progesterone). Postpartum depression screening: negative.  The following portions of the patient's history were reviewed and updated as appropriate: allergies, current medications, past family history, past medical history, past social history, past surgical history and problem list.  Review of Systems Pertinent items are noted in HPI.   Objective:    BP 112/74  Pulse 86  Temp(Src) 98 F (36.7 C)  Ht 5' (1.524 m)  Wt 170 lb (77.111 kg)  BMI 33.20 kg/m2  LMP 04/20/2013  Breastfeeding? Yes       General:  alert     Abdomen: soft, non-tender; bowel sounds normal; no masses,  no organomegaly   Vulva:  normal  Vagina: normal vagina  Cervix:  no lesions  Corpus: normal size, contour, position, consistency, mobility, non-tender  Adnexa:  normal adnexa    Assessment:     Normal postpartum exam.   Plan:    1. Contraception: Camila 2. Follow up as needed.

## 2013-04-23 NOTE — Patient Instructions (Signed)
Patient information: Common breastfeeding problems (Beyond the Basics)  Author Jeanne Spencer, MD Section Editor Steven A Abrams, MD Deputy Editors Melanie S Kim, MD Constanza Villalba, PhD Disclosures: Jeanne Spencer, MD Nothing to disclose. Steven A Abrams, MD Grant/Research/Clinical Trial Support: [employer] (infant nutrition [specialized formulas for preterm infants]). Melanie S Kim, MD Employee of [employer] Constanza Villalba, PhD Employee of [employer]  Contributor disclosures are reviewed for conflicts of interest by the editorial group. When found, these are addressed by vetting through a multi-level review process, and through requirements for references to be provided to support the content. Appropriately referenced content is required of all authors and must conform to [employer] standards of evidence.  Conflict of interest policy  All topics are updated as new evidence becomes available and our peer review process is complete.  Literature review current through: Apr 2014.  This topic last updated: Jul 15, 2012.  BREASTFEEDING PROBLEMS OVERVIEW - Breastfeeding is universally recognized as the best way to feed an infant because it protects mother and infant from a variety of health problems. Even so, many women who start out breastfeeding stop before the recommended minimum of exclusive breastfeeding for six months. Often women stop because common problems interfere with their ability to breastfeed. Luckily, with sound guidance and appropriate medical treatment, most women can overcome these obstacles and continue breastfeeding for longer periods. This topic discusses common problems associated with breastfeeding and how to handle them. Other aspects of breastfeeding are discussed elsewhere. (See "Patient information: Deciding to breastfeed (Beyond the Basics)" and "Patient information: Breastfeeding guide (Beyond the Basics)" and "Patient information: Maternal health and nutrition  during breastfeeding (Beyond the Basics)" and "Patient information: Breast pumps (Beyond the Basics)".) INADEQUATE MILK INTAKE - The most common reason women stop breastfeeding is that they think their infant is not getting enough milk, but in many cases the mother has an adequate supply. A true inadequate supply can happen if the infant is unable to extract milk well or if the mother doesn't make enough milk. Unfortunately, figuring out if a mother has enough milk and if not, why not, can be challenging. Inadequate milk production - There are a number of reasons why a mother might not make enough milk, including that: ?Her breasts did not develop sufficiently during pregnancy - This can happen if she doesn't have enough milk-producing tissue (called glandular tissue) (figure 1). ?She previously had breast surgery or radiation treatment. ?She has a hormonal imbalance. ?She takes certain medications that interfere with milk production. Women who have had breast surgery, such a breast augmentation or breast reduction surgery, often have trouble making enough milk. For some, breastfeeding is impossible. If you had breast surgery, ask your healthcare provider if the type of surgery you had would totally interfere with breastfeeding. If not, or if you are unable to get complete information on your surgery, do go ahead and try, but make sure your healthcare provider closely monitors your baby's progress. Poor milk extraction - The most common reasons infants have trouble getting enough milk are: ?They do not get fed frequently enough (which can cause milk production to slow or stop) ?They cannot latch on properly (figure 2) ("A Perfect Latch" video; for a good demonstration of latch, place the timer of the video on minute 7:43) ?They are separated from their mother too much ?They are fed formula Many babies are sleepy and difficult to keep awake during the first several days after birth. This can prevent the  baby from getting   enough to eat. Other babies can have trouble controlling the muscles involved in suckling, which makes it hard for them to extract milk. Feeding difficulty is especially common among premature and late preterm babies. Many mothers judge adequacy of feeding by lack of crying. This can be misleading if the baby is not getting enough milk and is overly sleepy. Diagnosis of inadequate intake - Healthcare providers determine whether a baby is getting enough milk based on the following: ?Number of feeding sessions the mother reports having - During the first week of life, mothers with term infants (meaning they are not premature) generally nurse 8 to 12 times in 24 hours. By four weeks after delivery, nursing usually decreases to seven to nine times per day. ?Amount of urine and stool the baby makes - By the fifth day of life, infants who are getting enough milk urinate six to eight times a day and have three or more stools a day. (Once a mother's milk comes in, her infant's stool should be pale yellow and seedy.) ?Weight of the baby - Term infants lose an average of 7 percent of their birth weight in the first three to five days of life. They typically get back to their birth weight within one to two weeks. Once a mother's breasts fill with milk - by day three to five - her infant should not keep losing weight. If an infant has lost 10 percent of its weight or fails to return to its birth weight when expected, healthcare providers start to explore potential problems. Household scales are not accurate enough to detect these small weight differences. If you are using a medical scale for infants, remember to weigh the infant with the same clothes and diaper before and after the feeding. Management of inadequate intake - If your healthcare provider suspects your baby is not getting enough milk, he or she will want to figure out why. To do that, the healthcare provider will ask you about your  experiences breastfeeding and about your and your baby's medical history. A healthcare provider should also watch as you try to breastfeed to see if there could be something wrong with the way your baby latches on or with the baby's mouth. If so, it will be important for you to learn how to position your baby so that the baby can latch on properly (figure 2 and figure 3). If you are having trouble with this, the healthcare provider will direct you to community resources ? often a lactation consultant ? for assistance. If your baby has a good latch, but you still have problems with inadequate milk intake, your healthcare provider might suggest that you try to feed more often or try to stimulate more milk production by using a breast pump or expressing by hand (figure 4) ("Hand Expression of Breastmilk" video). There are medications called galactagogues (or lactagogues) that supposedly increase milk production, but it's unclear whether these medications actually work and whether they are safe for a nursing baby, so we do not recommend their use. NIPPLE AND BREAST PAIN - The second most common reason mothers stop breastfeeding early is nipple or breast pain. The causes of nipple and breast pain include: ?Nipple injury (caused by the baby or a breast pump) ?Engorgement, which means the breasts get overly full ?Plugged milk ducts ?Nipple and breast infections ?Excessive milk supply ?Skin disorders (such as dermatitis or psoriasis) affecting the nipple ?Nipple vasoconstriction, which means the blood vessels in the nipple tighten and do not let   enough blood through Possible causes of breast or nipple pain related to the baby could include: ?Ankyloglossia (also called tongue-tie), which is when the baby's tongue cannot move as freely as it should, making it hard for the baby to suckle effectively ?Torticollis, which is when the baby's neck is twisted, making it hard for the baby to nurse from both breasts  comfortably ?Birth defects in the shape of the baby's mouth that make it hard for the baby to latch on ?Uncoordinated suck, which is when the baby does not move its tongue in the correct rhythm to extract milk To determine the cause of your pain, your healthcare provider will examine you and your baby, and watch you breastfeed. He or she will also ask about your pain (when it started, what makes it better or worse), and about aspects of your health that could hold clues about the cause of your pain. The most important part of the exam takes place when the healthcare provider watches you breastfeed. That's because most cases of breast pain in the nursing mother are due to incorrect breastfeeding technique. One common problem is that the baby is not latching on properly, and so injures the nipple, but also cannot empty the breast. This, in turn, can lead to engorgement, plugged ducts, and breast infections. Nipple pain - Sore nipples are one of the most common complaints by new mothers. Pain due to nipple injury needs to be distinguished from nipple sensitivity, which normally increases during pregnancy and peaks about four days after giving birth. You can usually tell the difference between normal nipple sensitivity and pain caused by nipple injury based on when it happens and how it changes over time. Normal sensitivity typically subsides 30 seconds after suckling begins. It also diminishes on the fourth day after giving birth and completely resolves when the baby is about a week old. Nipple pain caused by trauma, on the other hand, persists or gets worse after suckling begins. Severe pain or pain that continues after the first week after birth is more likely to be due to nipple injury. Normal nipple sensitivity - If you have some discomfort related to normal nipple sensitivity, keep in mind that this sensitivity usually goes away after the first few suckles of a feeding, and stops happening after the first  week or two of nursing. If you find the "pins and needles" sensation of milk let-down to be uncomfortable, rest assured this discomfort also resolves in the first weeks of breastfeeding. If needed, you can take acetaminophen (sample brand name: Tylenol) to ease your discomfort. Nipple injury - Nipple injury usually is due to incorrect breastfeeding technique, particularly poor position or latch-on. Other factors that can make pain caused by injury worse include harsh breast cleansing, use of potentially irritating products, and biting by an older infant. Here are some things you can do to prevent nipple injury: ?Learn how to position your baby so that the baby can latch on properly (figure 2 and figure 3). If you are having trouble with this, get help from a healthcare provider or a lactation consultant. ?Try to keep your nipples dry, and allow them to air-dry after feedings. ?Do not use harsh soaps or cleansers on your breasts. ?If your baby's mouth has any abnormalities, make sure to have them addressed as soon as possible. For example, if your baby has tongue-tie, surgery to release the tongue will make it easier for the baby to latch on properly. ?If your baby is biting you, position   the baby so that his or her mouth is wide open during feedings. That will make it harder to bite. Also, stick your finger between your nipple and the baby's mouth any time he or she bites you and firmly say "no." Then put the baby down in a safe place. The baby will learn not to bite you. Here are some things you can do to promote healing if your nipples are already injured: ?Always start nursing with the breast that does not have the injury. ?If your nipples are cracked or raw, put an ointment on them, such as Vitamin A and D or purified lanolin (if you are not allergic) and cover them with a nonstick pad. This will help prevent nipple infection and keep the injured part of your nipple from sticking to your bra. If you  think your nipple is infected or you have a rash, see your healthcare provider. ?Use cool or warm compresses, if they seem to help. Avoid ice. ?Take a mild pain reliever, such as acetaminophen (brand name Tylenol) or ibuprofen (sample brand names: Advil, Motrin), before feeding. ?If nipple pain prevents your baby from emptying your breasts, try using a pump or hand expression to empty your breasts. This will give your nipples a chance to heal and prevent engorgement. Use the milk you remove to feed your baby. ?Do NOT use vitamin E oil on your nipples. At high levels, it could be toxic to your baby. Nipple vasoconstriction - Nipple vasoconstriction is when the blood vessels in the nipple tighten and do not let enough blood through. Mothers with this problem can have pain, burning, or numbness in their nipples in response to cold, nursing, or injury. The nipples can also turn white or blue, and then pink when the blood returns. One way to tell nipple vasoconstriction apart from other causes of nipple pain is that it can be predictably triggered by cold, while other causes of pain cannot. To manage nipple vasoconstriction, try to keep your whole body warm and dress warmly. Also, if possible, try to breastfeed in warm conditions. It might also help to avoid nicotine and caffeine, as they can make the problem worse. Engorgement - Engorgement is the medical term for when the breasts get too full of milk. It can make your breast feel full and firm and can cause pain and tenderness. Engorgement can sometimes impair the baby's ability to latch, which makes engorgement worse, because the baby cannot then empty the breast. Here are some things you can do to prevent and deal with engorgement: ?Learn how to position your baby so that the baby can latch on properly (figure 2 and figure 3). If you are having trouble with this, get help from a healthcare provider or a lactation consultant. ?If the engorgement makes it  hard for your baby to latch on, manually express a small amount of milk before each feeding to soften your areola and make it easier for the baby to latch on (figure 4). To do this, place your thumb and forefingers well behind your areola (close to your chest) and then compress them together and toward your nipple in a rhythmic fashion. You can also use your hand to present your nipple in a way that is easier to latch and to help get milk out for the baby while the baby is suckling. ?You can use a breast pump to help soften your breast before a feeding, but be careful not to do it too much. Using a pump too   much will stimulate your breast to make even more milk, which will make engorgement worse. ?Apply warm compresses or take a warm shower before a feeding. This can enhance let-down and may make it easier to get milk out. ?Take a mild pain reliever, such as acetaminophen (brand name Tylenol) or ibuprofen (sample brand names: Advil, Motrin). Plugged ducts - A plugged milk duct can cause a tender or painful lump to form on the breast. If the nipple itself is plugged, a white dot or bleb can form at the end of the nipple. Things that can lead to a plugged milk duct include poor feeding technique, wearing tight clothing or an ill-fitting bra, abrupt decrease in feeding, engorgement, and infections. Here are some things you can do to prevent and deal with a plugged duct: ?Learn how to position your baby so that the baby can latch on properly (figure 2 and figure 3). If you are having trouble with this, get help from a healthcare provider or a lactation consultant. Make sure to vary your position during feedings so every part of the breast gets emptied. You might even try to position the baby so that his or her chin is near the plugged area, because this positioning can help drain that area best. You can also try pumping or manually expressing after feedings to improve drainage. Do not stop breastfeeding, as this  could lead to engorgement and worsen the problem. ?Try using warm compresses or taking a warm shower and then manually massaging your breast from the outer part of your breast toward the nipple to advance the blockage toward the nipple or in the opposite direction to clear the duct. ?Take a mild pain reliever, such as acetaminophen (brand name Tylenol) or ibuprofen (sample brand names: Advil, Motrin). ?If your blockage does not get better within two days, see your healthcare provider, as what appears to be a blocked duct may be something more concerning. Galactoceles - Sometimes a blocked milk duct can cause a milk-filled cyst called a galactocele to form (picture 1). Unless they are infected, galactoceles are usually painless, but they can get quite large. If necessary, a healthcare provider can drain a galactocele using a needle, or suggest surgery if the problem is severe. BREAST INFECTIONS Lactational mastitis - Mastitis is an inflammation of the breast that is often associated with fever (which might be masked by pain medications), muscle and breast pain, and redness. It is not always caused by an infection, but most people associate it with infection. Mastitis can happen at any time during lactation, but it is most common during the first six weeks after delivery. Mastitis tends to occur if the nipples are damaged or the breasts stay engorged for too long or do not drain properly. To prevent and treat mastitis, it's important to get these problems under control. If you have any of the following symptoms, see your healthcare provider: ?A firm, red, and tender area of the breast ?Fever higher than 101?F or 38.5?C ?Muscle aches, chills, malaise, or flu-like symptoms If you do have an infection, your healthcare provider will probably put you on antibiotics. Here are some things you can do to manage mastitis: ?Take your antibiotics exactly as directed, if your healthcare provider prescribes them. If  you don't start to feel better within two to three days of starting antibiotics, call your healthcare provider. You may need a different antibiotic or may have a different problem. ?Continue breastfeeding, even while you are being treated, and work on your   feeding technique, so that your breasts empty well. ?Take a mild pain reliever, such as acetaminophen (sample brand name Tylenol) or ibuprofen (sample brand names: Advil, Motrin), if you think it could help. ?Apply cold compresses or ice packs. Yeast infection - Many women who are breastfeeding are diagnosed with a yeast infection of the nipple or breast (also called candidal infection) based on their symptoms (primarily nipple pain). Even so, yeast infections of the nipple or breast are poorly understood, and researchers aren't sure what role they play in nipple pain. For now, healthcare providers often diagnose yeast infections in women who have: ?Breast pain out of proportion to any apparent cause ?A history of vaginal yeast infections or an infant with a history of yeast infections such as thrush or diaper rash ?Shiny or flaky skin on the affected nipple ?Skin scrapings from the nipple or areola that test positive for yeast (or breast milk that does) Treatments include: ?Topical antifungals - Topical antifungals are creams or gels that contain medications called antifungals, which kill yeast. Women using these agents must wipe any remaining medication they can see off their nipples before each feeding and reapply them after each feeding. Antifungal ointments are not recommended, as the paraffins they contain could be harmful to the infant. ?Gentian violet - Gentian violet 0.25 to 1% is a purple medication that you apply to your baby's mouth with a Q-tip before a feeding. After the feeding, you apply more gentian violet to any areas of your nipple and areola that are not already purple. You do this once a day for three to four days. Gentian violet  is a bit messy and can sometimes irritate the baby's mouth or the nipple area, but it is effective. ?Antifungal pills - Mothers who do not get better with the options described above can be put on prescription antifungal pills. They can continue to breastfeed while on these pills, as the typical amount of drug that makes it into breast milk is safe for breastfeeding infants. BLOODY NIPPLE DISCHARGE - Some women have bloody nipple discharge during the first days to weeks of lactation. This is more common with the first pregnancy and has been called rusty pipe syndrome. It is thought to be caused by the increased blood flow to the breasts and ducts that happens when the mother starts making milk. The color of the milk varies from pink to red and generally resolves within a few days. Women who have bloody discharge for more than a week should be seen by a healthcare provider. MILK OVERSUPPLY - Some mothers make too much milk, which paradoxically can make breastfeeding difficult. Generally the production of milk is determined by the infant's demand, but in this case the supply exceeds demand. The problem begins early in lactation and is most common among women having their first child. In women with an oversupply of milk, the rush of the milk can be so strong that it causes the infant to choke and cough and have trouble feeding, or even to bite down to clamp the nipple. Infants whose mothers make too much milk can either gain weight quickly or gain too little weight because they cannot handle the flow of milk, or because they do not get the last of the milk in the breast, which has the most calories. If you have a problem with overproduction, don't worry. The problem usually goes away on its own. But tell your healthcare provider about it, so he or she can check whether you have   any hormonal imbalances or take any medications that could make the problems worse. Here are some things you can do to deal with milk  oversupply: ?Nurse in an upright position - Hold your baby upright to nurse and lean back or lie on your side (figure 3 and picture 2). This will give the baby better control of the flow of milk. ?Use your fingers to reduce the flow of milk - Try putting a scissors-hold on your areola or pressing on your breast with the heel of your hand to restrict flow. ?Give baby control - Let your baby interrupt feedings, and burp him or her often. ?Pump very little or not at all - Avoid pumping, because it can stimulate even more milk production, but you can hand-pump a little at the beginning of a feeding to relieve some of the pressure. ?Apply cold water or ice to your nipples to decrease leaking WHEN TO SEEK HELP - If you are unable to breastfeed due to engorgement, pain, or difficulty latching your infant, help is available. Talk to your obstetrical or pediatric healthcare provider, nurse, lactation consultant, or a breastfeeding counselor. (See 'Finding a lactation consultant' below.) WHERE TO GET MORE INFORMATION - Your healthcare provider is the best source of information for questions and concerns related to your medical problem. 

## 2013-06-18 ENCOUNTER — Ambulatory Visit: Payer: Managed Care, Other (non HMO) | Admitting: Obstetrics & Gynecology

## 2013-12-08 ENCOUNTER — Other Ambulatory Visit: Payer: Self-pay | Admitting: Obstetrics

## 2013-12-08 NOTE — Telephone Encounter (Signed)
Please advise 

## 2014-01-19 ENCOUNTER — Encounter: Payer: Self-pay | Admitting: Obstetrics & Gynecology

## 2014-02-21 IMAGING — US US OB TRANSVAGINAL
1 series · 13 of 28 positions shown · non-contrast
Comparison: none

[Series 1: us ob transvaginal · 13 of 38 slices shown]
[im 2/38]
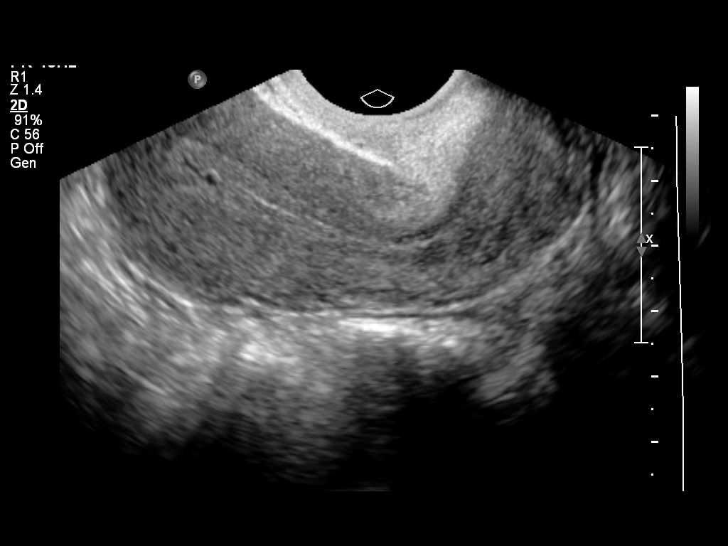
[im 5/38]
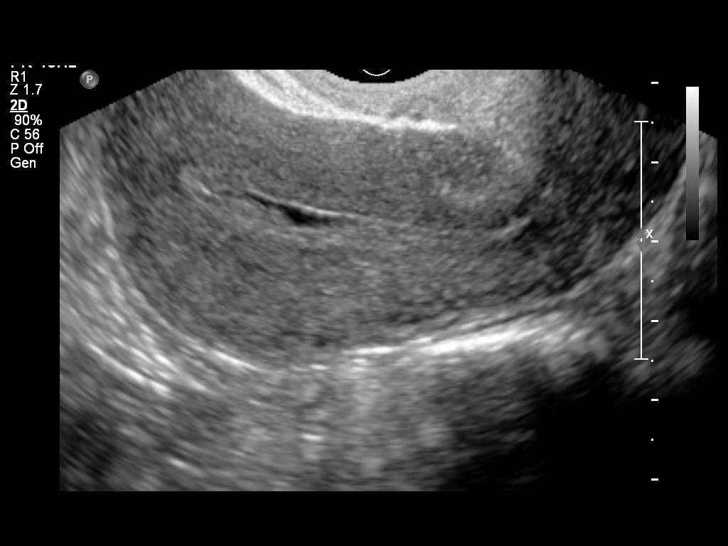
[im 7/38]
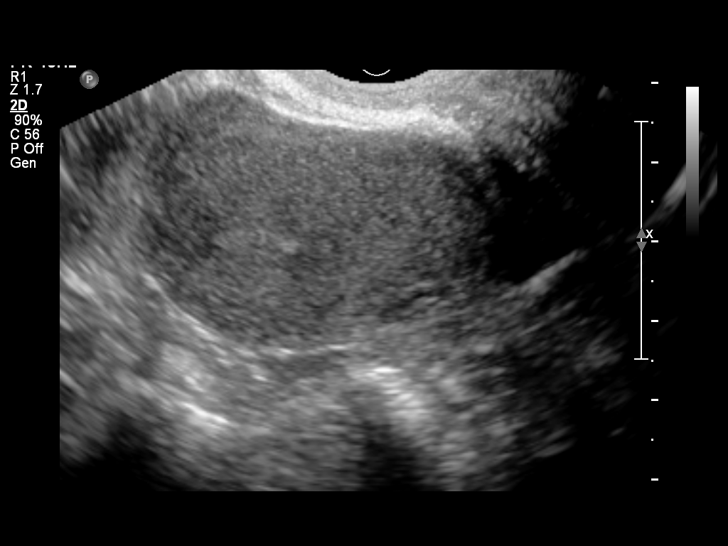
[im 10/38]
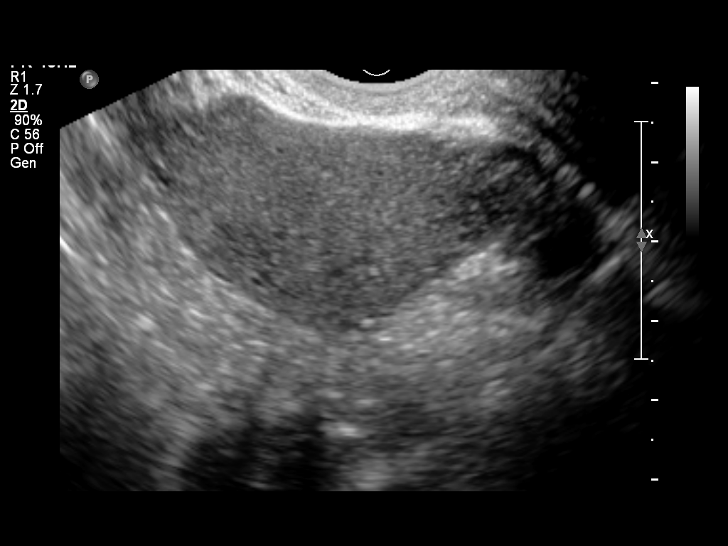
[im 13/38]
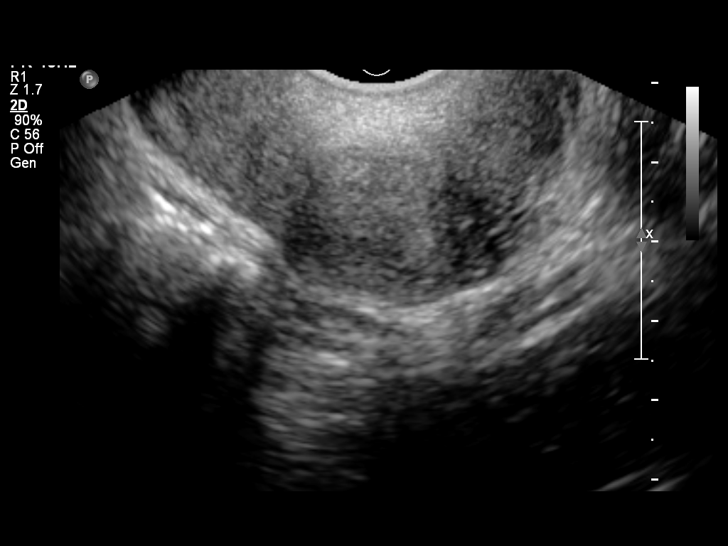
[im 16/38]
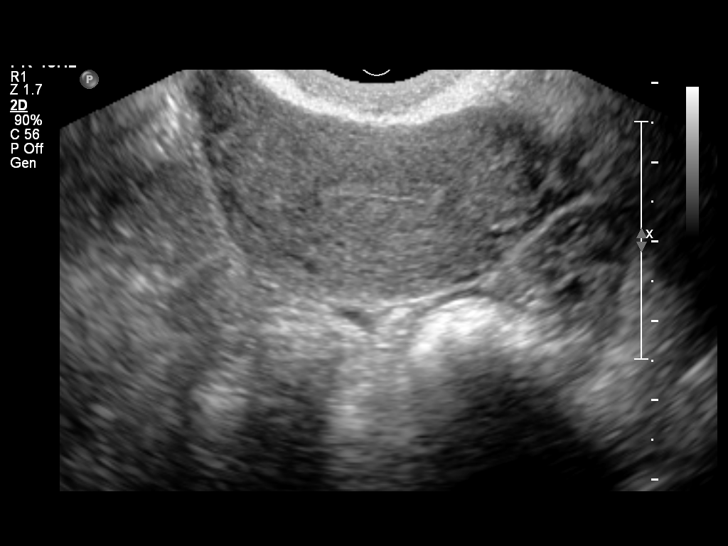
[im 20/38]
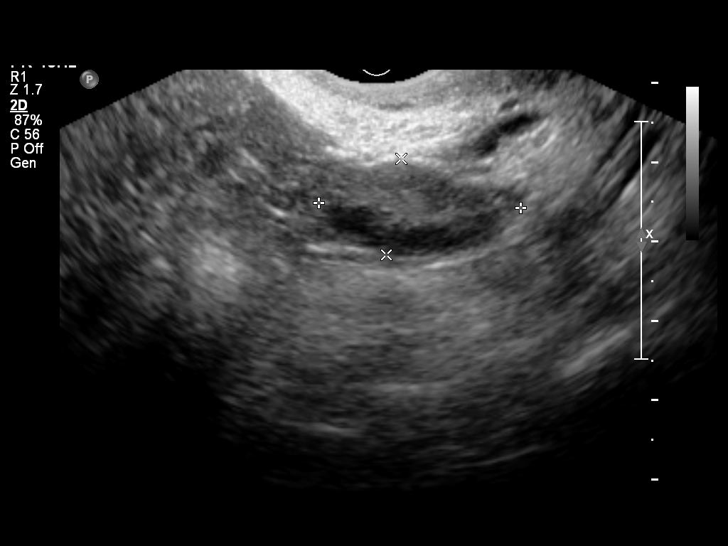
[im 22/38]
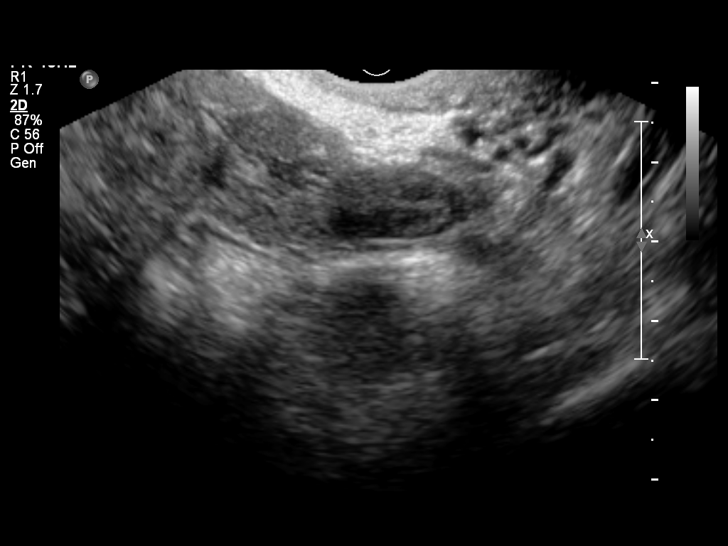
[im 25/38]
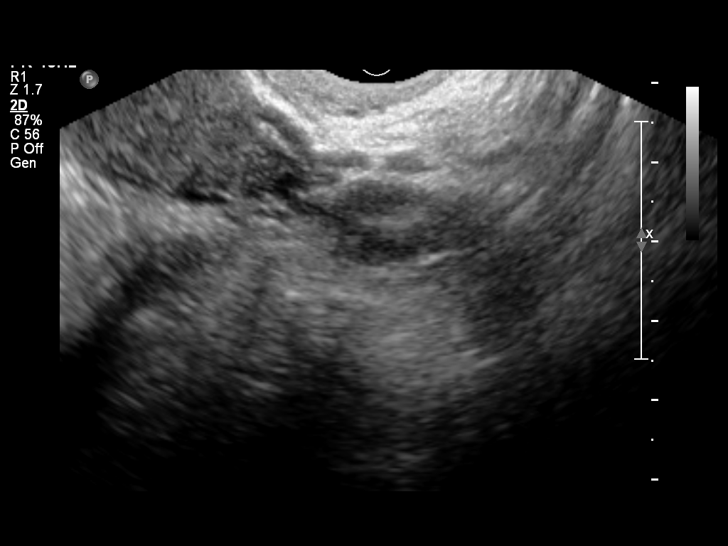
[im 28/38]
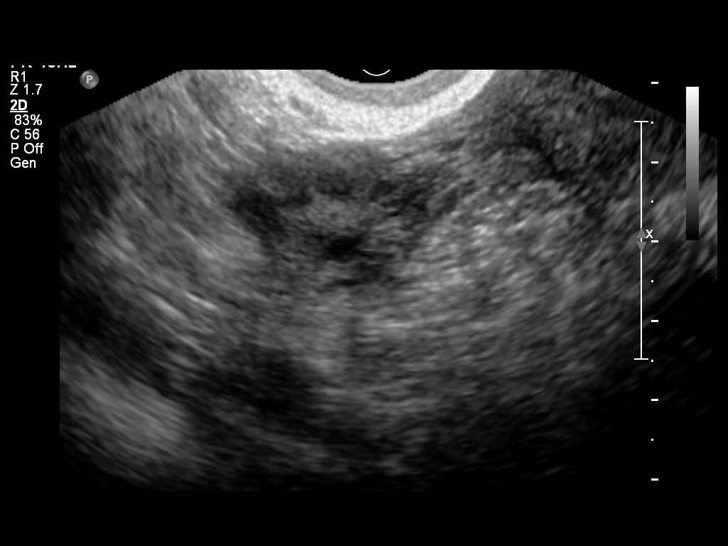
[im 31/38]
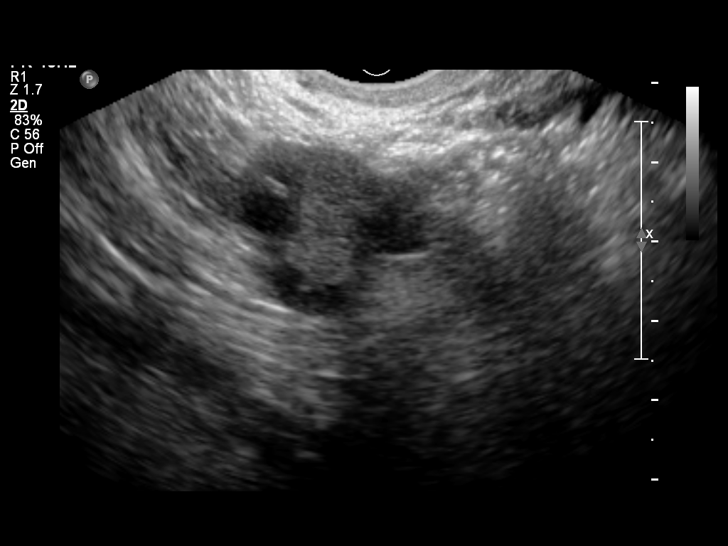
[im 33/38]
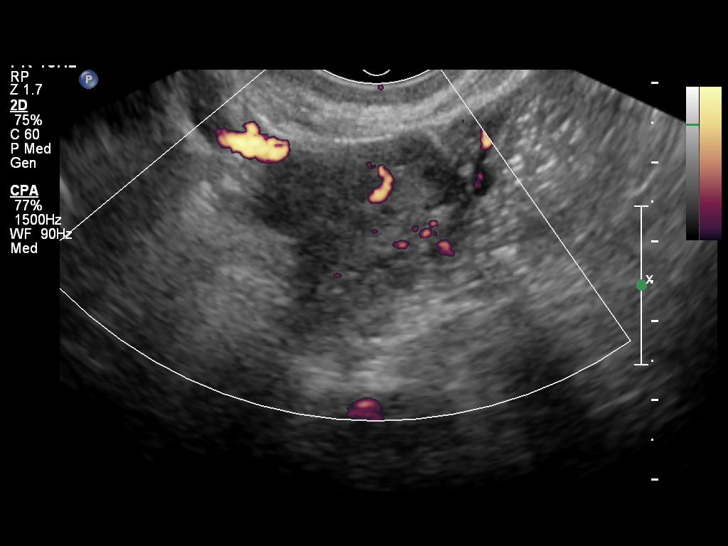
[im 36/38]
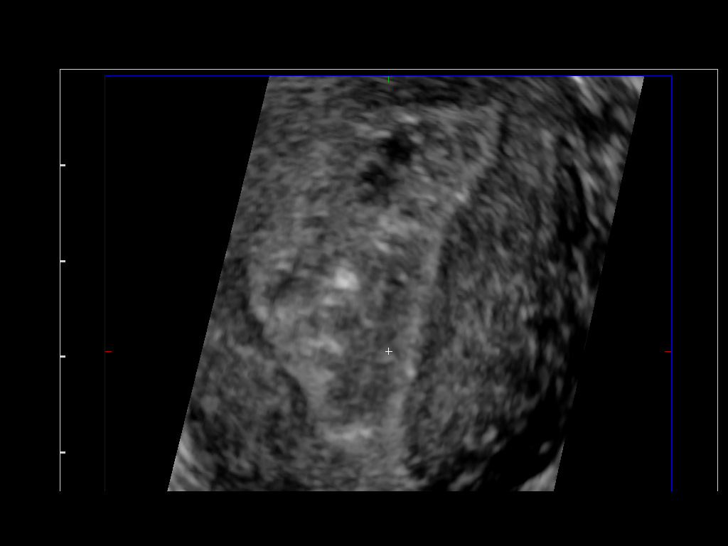

[13 of 28 positions shown; findings below may reference images not displayed]

OBSTETRICS REPORT
                      (Signed Final 02/29/2012 [DATE])

Service(s) Provided

 US OB TRANSVAGINAL                                    76817.0
Indications

 Vaginal bleeding, unknown etiology
Fetal Evaluation

 Num Of Fetuses:    0
 Gest. Sac:         None seen
 Cardiac Activity:  No embryo visualized
Gestational Age

 Best:          6w 1d     Det. By:   LMP  (01/17/12)          EDD:   10/23/12
Cervix Uterus Adnexa

 Uterus:       No abnormality visualized. Tiny amount of fluid in [REDACTED].
 Cul De Sac:   No free fluid seen.
 Left Ovary:   Size(cm) L: 2.55 x W: 1.67 x H: 1.23  Volume(cc):
               2.7. Within normal limits.
 Right Ovary:  Size(cm) L: 3.18 x W: 2.01 x H: 2.36  Volume(cc):
               7.9. Within normal limits.
Impression

 No evidence for an intrauterine gestational sac is seen today
 with a small focus of fluid seen within the canal and otherwise
 thin lining. As a probably early IUGS was seen on the prior
 exam on 02/21/2012, this likely indicates an interval SAB.

 No adnexal masses or pelvic fluid are seen. Follow up with
 serial bHCG is recommended to confirm this impression and
 exclude with certainty the possibility of a sonographically
 silent ectopic gestation.

 questions or concerns.

## 2014-03-16 ENCOUNTER — Encounter: Payer: Self-pay | Admitting: *Deleted

## 2014-03-17 ENCOUNTER — Encounter: Payer: Self-pay | Admitting: Obstetrics & Gynecology
